# Patient Record
Sex: Male | Born: 1965 | Race: White | Hispanic: No | Marital: Single | Smoking: Never smoker
Health system: Southern US, Community
[De-identification: ages and names within clinical notes are randomized; demographics above are authoritative.]

## PROBLEM LIST (undated history)

## (undated) DIAGNOSIS — E785 Hyperlipidemia, unspecified: Secondary | ICD-10-CM

## (undated) DIAGNOSIS — M109 Gout, unspecified: Secondary | ICD-10-CM

## (undated) DIAGNOSIS — E079 Disorder of thyroid, unspecified: Secondary | ICD-10-CM

## (undated) DIAGNOSIS — I10 Essential (primary) hypertension: Secondary | ICD-10-CM

## (undated) DIAGNOSIS — K219 Gastro-esophageal reflux disease without esophagitis: Secondary | ICD-10-CM

## (undated) HISTORY — DX: Gout, unspecified: M10.9

## (undated) HISTORY — DX: Gastro-esophageal reflux disease without esophagitis: K21.9

---

## 2000-04-26 ENCOUNTER — Ambulatory Visit (HOSPITAL_COMMUNITY): Admission: RE | Admit: 2000-04-26 | Discharge: 2000-04-26 | Payer: Self-pay

## 2004-09-29 ENCOUNTER — Ambulatory Visit: Payer: Self-pay | Admitting: Family Medicine

## 2004-09-30 ENCOUNTER — Ambulatory Visit: Payer: Self-pay | Admitting: Family Medicine

## 2004-10-04 ENCOUNTER — Ambulatory Visit: Payer: Self-pay | Admitting: Family Medicine

## 2005-08-25 ENCOUNTER — Ambulatory Visit: Payer: Self-pay | Admitting: Family Medicine

## 2005-08-30 ENCOUNTER — Ambulatory Visit: Payer: Self-pay | Admitting: Family Medicine

## 2006-09-12 ENCOUNTER — Ambulatory Visit: Payer: Self-pay | Admitting: Family Medicine

## 2007-01-19 ENCOUNTER — Ambulatory Visit: Payer: Self-pay | Admitting: Family Medicine

## 2007-02-13 ENCOUNTER — Ambulatory Visit: Payer: Self-pay | Admitting: Family Medicine

## 2007-09-12 ENCOUNTER — Ambulatory Visit (HOSPITAL_BASED_OUTPATIENT_CLINIC_OR_DEPARTMENT_OTHER): Admission: RE | Admit: 2007-09-12 | Discharge: 2007-09-12 | Payer: Self-pay | Admitting: Family Medicine

## 2007-09-15 ENCOUNTER — Ambulatory Visit: Payer: Self-pay | Admitting: Internal Medicine

## 2007-11-13 ENCOUNTER — Ambulatory Visit (HOSPITAL_BASED_OUTPATIENT_CLINIC_OR_DEPARTMENT_OTHER): Admission: RE | Admit: 2007-11-13 | Discharge: 2007-11-13 | Payer: Self-pay | Admitting: Family Medicine

## 2007-11-17 ENCOUNTER — Ambulatory Visit: Payer: Self-pay | Admitting: Internal Medicine

## 2011-03-29 NOTE — Procedures (Signed)
NAME:  Hayden Hayes, Hayden Hayes NO.:  [id number]   MEDICAL RECORD NO.:  [id number]          PATIENT TYPE:  OUT   LOCATION:  SLEEP CENTER                 FACILITY:  [facility]   PHYSICIAN:  Clinton D. Maple Hudson, MD, FCCP, FACPDATE OF BIRTH:  1965-01-27   DATE OF STUDY:  11/13/2007                            NOCTURNAL POLYSOMNOGRAM   REFERRING PHYSICIAN:  Delaney Meigs, M.D.   REFERRING PHYSICIAN:  Delaney Meigs, M.D.   INDICATIONS FOR STUDY:  Hypersomnia with sleep apnea.   EPWORTH SLEEPINESS SCORE:  9/24.  BMI 51.  Weight 270 pounds.  Height 61  inches.  Neck 18 inches.   MEDICATIONS:  Charted and reviewed.   A diagnostic study on September 12, 2007, recorded a baseline AHI of 8.4  per hour.  CPAP titration is now requested.   SLEEP ARCHITECTURE:  Total sleep time is 397 minutes with sleep  efficiency 88.4%.  Stage 1 was 6.8%, stage 2 82%, stage 3 was absent.  REM 11.2% of total sleep time.  Sleep latency 9 minutes.  REM latency  150 minutes.  Awake after sleep onset 41 minutes.  Arousal index 11.6.  Aspirin, Lipitor, enalapril and Seroquel were taken at bedtime.   RESPIRATORY DATA:  CPAP titration protocol.  CPAP was titrated to 12  CWP, AHI 0 per hour.  A large Quattro full face mask was used with  heated humidifier.   OXYGEN DATA:  Snoring was prevented by CPAP and oxygen saturation held  at 95.4% on room air.   CARDIAC DATA:  Normal sinus rhythm.   MOVEMENT-PARASOMNIA:  Periodic limb movement syndrome with a total of 99  events averaging 15 per hour of sleep.   IMPRESSIONS-RECOMMENDATIONS:  1. Successful CPAP titration to 12 CWP, AHI 0 per hour.  A [place]      Quattro full face mask size large was used with heated humidifier.  2. Baseline diagnostic NPSG on September 12, 2007, had recorded an AHI      of 8.4 per hour.  3. Periodic limb movement with arousal 15 per hour.  This is often      associated with the disturbance in CPAP      titration.  If similar  pattern contributes to sleep disturbance at      home then specific therapy such as Requip or Mirapex might be      considered if clinically appropriate.      Clinton D. Maple Hudson, MD, [facility], FACP  Diplomate, Biomedical engineer of Sleep Medicine  Electronically Signed     CDY/MEDQ  D:  11/17/2007 11:02:34  T:  11/17/2007 11:44:28  Job:  [id number]

## 2011-03-29 NOTE — Procedures (Signed)
NAME:  Hayden Hayes, Hayden Hayes NO.:  [id number]   MEDICAL RECORD NO.:  [id number]          PATIENT TYPE:  OUT   LOCATION:  SLEEP CENTER                 FACILITY:  [facility]   PHYSICIAN:  Clinton D. Maple Hudson, MD, FCCP, FACPDATE OF BIRTH:  03/03/65   DATE OF STUDY:  09/12/2007                            NOCTURNAL POLYSOMNOGRAM   REFERRING PHYSICIAN:  Delaney Meigs, M.D.   INDICATION FOR STUDY:  Hypersomnia with sleep apnea.   EPWORTH SLEEPINESS SCORE:  5/24, BMI 53.7, weight 275 pounds, height 60  inches, neck 16 inches.   MEDICATIONS:  Home medications:  Lipitor, enalapril, Seroquel.   Diagnostic NPSG requested.   SLEEP ARCHITECTURE:  Total sleep time 387 minutes with sleep efficiency  94%.  Stage I was 9%, stage II 80%, stage III 9%, REM 9% of total sleep  time.  Sleep latency 11 minutes, REM latency 145 minutes, awake after  sleep onset 13 minutes, arousal index 14.  No bedtime medication taken.   RESPIRATORY DATA:  Apnea-hypopnea index (AHI) 8.4 obstructive events per  hour, indicating mild obstructive sleep apnea/hypopnea syndrome.  This  included 6 obstructive apneas, 1 mixed apnea and 47 hypopneas.  Events  were positional, more common while supine (AHI 11).  REM AHI 24.   OXYGEN DATA:  Very loud snoring and oral venting with oxygen  desaturation to a nadir of 75%.  Mean oxygen saturation through the  study was 94% on room air.   CARDIAC DATA:  Normal sinus rhythm.   MOVEMENT-PARASOMNIA:  Frequent limb jerks were noted with only  occasional effect on sleep.   IMPRESSIONS-RECOMMENDATIONS:  1. Mild obstructive sleep apnea/hypopnea syndrome (AHI 8.4 per hour)      with positional events more common while supine.  Very loud snoring      with oxygen desaturation to a nadir of 75 percent.  2. Scores in this range may be addressed with continuous positive      airway pressure therapy if the patient is      symptomatic.  Consider return for continuous positive  airway      pressure titration or evaluate for alternative therapies including      weight loss, as appropriate.      Clinton D. Maple Hudson, MD, [facility], FACP  Diplomate, Biomedical engineer of Sleep Medicine  Electronically Signed     CDY/MEDQ  D:  09/15/2007 18:07:44  T:  09/16/2007 23:33:55  Job:  [id number]

## 2014-04-30 DIAGNOSIS — I10 Essential (primary) hypertension: Secondary | ICD-10-CM | POA: Insufficient documentation

## 2014-04-30 DIAGNOSIS — M109 Gout, unspecified: Secondary | ICD-10-CM | POA: Insufficient documentation

## 2014-04-30 DIAGNOSIS — E785 Hyperlipidemia, unspecified: Secondary | ICD-10-CM | POA: Insufficient documentation

## 2018-08-29 DIAGNOSIS — E039 Hypothyroidism, unspecified: Secondary | ICD-10-CM | POA: Insufficient documentation

## 2019-07-11 ENCOUNTER — Other Ambulatory Visit: Payer: Self-pay

## 2019-07-11 ENCOUNTER — Encounter: Payer: Self-pay | Admitting: Family

## 2019-07-11 ENCOUNTER — Encounter (INDEPENDENT_AMBULATORY_CARE_PROVIDER_SITE_OTHER): Payer: Self-pay

## 2019-07-11 ENCOUNTER — Ambulatory Visit: Payer: Medicaid Other | Admitting: Family

## 2019-07-11 VITALS — BP 124/80 | HR 78 | Temp 98.4°F | Ht 61.0 in | Wt 396.0 lb

## 2019-07-11 DIAGNOSIS — I10 Essential (primary) hypertension: Secondary | ICD-10-CM

## 2019-07-11 DIAGNOSIS — D1779 Benign lipomatous neoplasm of other sites: Secondary | ICD-10-CM

## 2019-07-11 DIAGNOSIS — E039 Hypothyroidism, unspecified: Secondary | ICD-10-CM | POA: Diagnosis not present

## 2019-07-11 DIAGNOSIS — F411 Generalized anxiety disorder: Secondary | ICD-10-CM | POA: Insufficient documentation

## 2019-07-11 DIAGNOSIS — R231 Pallor: Secondary | ICD-10-CM

## 2019-07-11 DIAGNOSIS — E785 Hyperlipidemia, unspecified: Secondary | ICD-10-CM

## 2019-07-11 DIAGNOSIS — F321 Major depressive disorder, single episode, moderate: Secondary | ICD-10-CM | POA: Insufficient documentation

## 2019-07-11 DIAGNOSIS — G4733 Obstructive sleep apnea (adult) (pediatric): Secondary | ICD-10-CM

## 2019-07-11 DIAGNOSIS — Z9989 Dependence on other enabling machines and devices: Secondary | ICD-10-CM

## 2019-07-11 DIAGNOSIS — Z8249 Family history of ischemic heart disease and other diseases of the circulatory system: Secondary | ICD-10-CM

## 2019-07-11 NOTE — Progress Notes (Signed)
Subjective:    Patient ID: Hayden Hayes, male    DOB: 1966/05/25, 53 y.o.   MRN: [id number]  Chief Complaint  Patient presents with  . New Patient (Initial Visit)   PT presents to the office today to establish care after his provider retired.  Hyperlipidemia This is a chronic problem. The current episode started more than 1 year ago. The problem is uncontrolled. Recent lipid tests were reviewed and are high. Exacerbating diseases include obesity. Associated symptoms include shortness of breath. Current antihyperlipidemic treatment includes statins. The current treatment provides mild improvement of lipids. Risk factors for coronary artery disease include dyslipidemia, male sex, hypertension, obesity, family history and a sedentary lifestyle.  Hypertension This is a chronic problem. The current episode started more than 1 year ago. The problem has been resolved since onset. The problem is controlled. Associated symptoms include malaise/fatigue, peripheral edema and shortness of breath. Risk factors for coronary artery disease include dyslipidemia, obesity, male gender and sedentary lifestyle. The current treatment provides moderate improvement. Identifiable causes of hypertension include a thyroid problem.  Thyroid Problem Presents for follow-up visit. Symptoms include depressed mood and fatigue. Patient reports no constipation. The symptoms have been stable. His past medical history is significant for hyperlipidemia.  OSA PT uses CPAP every night. Stable.     Review of Systems  Constitutional: Positive for fatigue and malaise/fatigue.  Respiratory: Positive for shortness of breath.   Gastrointestinal: Negative for constipation.  All other systems reviewed and are negative.   Family History  Problem Relation Age of Onset  . Heart disease Mother   . Epilepsy Mother   . Heart disease Father        Heart attacks and stents in neck   Social History   Socioeconomic History  .  Marital status: Single    Spouse name: Not on file  . Number of children: Not on file  . Years of education: Not on file  . Highest education level: Not on file  Occupational History  . Occupation: Disabled  Social Needs  . Financial resource strain: Not on file  . Food insecurity    Worry: Not on file    Inability: Not on file  . Transportation needs    Medical: Not on file    Non-medical: Not on file  Tobacco Use  . Smoking status: Never Smoker  . Smokeless tobacco: Never Used  Substance and Sexual Activity  . Alcohol use: Never    Frequency: Never  . Drug use: Never  . Sexual activity: Not on file  Lifestyle  . Physical activity    Days per week: Not on file    Minutes per session: Not on file  . Stress: Not on file  Relationships  . Social Herbalist on phone: Not on file    Gets together: Not on file    Attends religious service: Not on file    Active member of club or organization: Not on file    Attends meetings of clubs or organizations: Not on file    Relationship status: Not on file  . Intimate partner violence    Fear of current or ex partner: Not on file    Emotionally abused: Not on file    Physically abused: Not on file    Forced sexual activity: Not on file  Other Topics Concern  . Not on file  Social History Narrative  . Not on file       Objective:  Physical Exam Vitals signs reviewed.  Constitutional:      General: He is not in acute distress.    Appearance: He is well-developed. He is obese.  HENT:     Head: Normocephalic.     Right Ear: Tympanic membrane normal.     Left Ear: Tympanic membrane normal.  Eyes:     General:        Right eye: No discharge.        Left eye: No discharge.     Pupils: Pupils are equal, round, and reactive to light.  Neck:     Musculoskeletal: Normal range of motion and neck supple.     Thyroid: No thyromegaly.  Cardiovascular:     Rate and Rhythm: Normal rate and regular rhythm.     Heart  sounds: Normal heart sounds. No murmur.  Pulmonary:     Effort: Pulmonary effort is normal. No respiratory distress.     Breath sounds: Normal breath sounds. No wheezing.  Abdominal:     General: Bowel sounds are normal. There is no distension.     Palpations: Abdomen is soft.     Tenderness: There is no abdominal tenderness.  Musculoskeletal: Normal range of motion.        General: No tenderness.  Skin:    General: Skin is warm and dry.     Coloration: Skin is pale.     Findings: No erythema or rash.     Comments: Large fatty area hanging from right   Neurological:     Mental Status: He is alert and oriented to person, place, and time.     Cranial Nerves: No cranial nerve deficit.     Deep Tendon Reflexes: Reflexes are normal and symmetric.  Psychiatric:        Behavior: Behavior normal.        Thought Content: Thought content normal.        Judgment: Judgment normal.     BP 124/80   Pulse 78   Temp 98.4 F (36.9 C) (Oral)   Ht '5\' 1"'  (1.549 m)   Wt (!) 396 lb (179.6 kg)   BMI 74.82 kg/m      Assessment & Plan:  Nayquan Evinger comes in today with chief complaint of New Patient (Initial Visit)   Diagnosis and orders addressed:  1. Essential hypertension with goal blood pressure less than 130/80 - CMP14+EGFR - Ambulatory referral to Cardiology  2. Hypothyroidism, unspecified type - CMP14+EGFR - TSH  3. Morbid obesity due to excess calories (HCC) - CMP14+EGFR  4. Hyperlipidemia, unspecified hyperlipidemia type - CMP14+EGFR - Lipid panel - Ambulatory referral to Cardiology  5. GAD (generalized anxiety disorder) - CMP14+EGFR  6. Depression, major, single episode, moderate (HCC) - CMP14+EGFR  7. OSA on CPAP - CMP14+EGFR - Ambulatory referral to Cardiology  8. Pale - CMP14+EGFR - Anemia Profile B  9. Family history of cardiovascular disease - CMP14+EGFR - Ambulatory referral to Cardiology  10. Lipoma of other specified sites Will do [place] and if any  type of tumor will send to General Surgery. If not, will do referral to Bariatric surgery  - [place] MiscellaneoUS Localization; Future   Labs pending Health Maintenance reviewed Diet and exercise encouraged  Follow up plan: 1 month   Evelina Dun, FNP

## 2019-07-11 NOTE — Patient Instructions (Signed)

## 2019-07-12 ENCOUNTER — Other Ambulatory Visit: Payer: Self-pay | Admitting: Family

## 2019-07-12 DIAGNOSIS — R1901 Right upper quadrant abdominal swelling, mass and lump: Secondary | ICD-10-CM

## 2019-07-13 LAB — ANEMIA PROFILE B
Basophils Absolute: 0 10*3/uL (ref 0.0–0.2)
Basos: 1 %
EOS (ABSOLUTE): 0.2 10*3/uL (ref 0.0–0.4)
Eos: 3 %
Ferritin: 183 ng/mL (ref 30–400)
Folate: 12.7 ng/mL (ref >3.0)
Hematocrit: 39.9 % (ref 37.5–51.0)
Hemoglobin: 13.1 g/dL (ref 13.0–17.7)
Immature Grans (Abs): 0 10*3/uL (ref 0.0–0.1)
Immature Granulocytes: 0 %
Iron Saturation: 22 % (ref 15–55)
Iron: 70 ug/dL (ref 38–169)
Lymphocytes Absolute: 1.5 10*3/uL (ref 0.7–3.1)
Lymphs: 30 %
MCH: 30.6 pg (ref 26.6–33.0)
MCHC: 32.8 g/dL (ref 31.5–35.7)
MCV: 93 fL (ref 79–97)
Monocytes Absolute: 0.6 10*3/uL (ref 0.1–0.9)
Monocytes: 11 %
Neutrophils Absolute: 2.8 10*3/uL (ref 1.4–7.0)
Neutrophils: 55 %
Platelets: 254 10*3/uL (ref 150–450)
RBC: 4.28 x10E6/uL (ref 4.14–5.80)
RDW: 14.6 % (ref 11.6–15.4)
Retic Ct Pct: 2.1 % (ref 0.6–2.6)
Total Iron Binding Capacity: 317 ug/dL (ref 250–450)
UIBC: 247 ug/dL (ref 111–343)
Vitamin B-12: 327 pg/mL (ref 232–1245)
WBC: 5.2 10*3/uL (ref 3.4–10.8)

## 2019-07-13 LAB — CMP14+EGFR
ALT: 17 IU/L (ref 0–44)
AST: 19 IU/L (ref 0–40)
Albumin/Globulin Ratio: 1.6 (ref 1.2–2.2)
Albumin: 4.6 g/dL (ref 3.8–4.9)
Alkaline Phosphatase: 64 IU/L (ref 39–117)
BUN/Creatinine Ratio: 19 (ref 9–20)
BUN: 15 mg/dL (ref 6–24)
Bilirubin Total: 0.5 mg/dL (ref 0.0–1.2)
CO2: 21 mmol/L (ref 20–29)
Calcium: 9.3 mg/dL (ref 8.7–10.2)
Chloride: 105 mmol/L (ref 96–106)
Creatinine, Ser: 0.79 mg/dL (ref 0.76–1.27)
GFR calc Af Amer: 118 mL/min/{1.73_m2} (ref >59)
GFR calc non Af Amer: 102 mL/min/{1.73_m2} (ref >59)
Globulin, Total: 2.9 g/dL (ref 1.5–4.5)
Glucose: 97 mg/dL (ref 65–99)
Potassium: 4.1 mmol/L (ref 3.5–5.2)
Sodium: 142 mmol/L (ref 134–144)
Total Protein: 7.5 g/dL (ref 6.0–8.5)

## 2019-07-13 LAB — LIPID PANEL
Chol/HDL Ratio: 4.1 ratio (ref 0.0–5.0)
Cholesterol, Total: 175 mg/dL (ref 100–199)
HDL: 43 mg/dL (ref >39)
LDL Calculated: 98 mg/dL (ref 0–99)
Triglycerides: 168 mg/dL — ABNORMAL HIGH (ref 0–149)
VLDL Cholesterol Cal: 34 mg/dL (ref 5–40)

## 2019-07-13 LAB — TSH: TSH: 2.74 u[IU]/mL (ref 0.450–4.500)

## 2019-07-18 ENCOUNTER — Other Ambulatory Visit: Payer: Self-pay | Admitting: Family

## 2019-07-19 ENCOUNTER — Ambulatory Visit (HOSPITAL_COMMUNITY): Payer: Medicaid Other | Attending: Family

## 2019-08-14 ENCOUNTER — Ambulatory Visit: Payer: Medicaid Other | Admitting: Cardiology

## 2019-08-28 ENCOUNTER — Ambulatory Visit: Payer: Medicaid Other | Admitting: Cardiology

## 2019-09-12 ENCOUNTER — Encounter: Payer: Self-pay | Admitting: Surgery

## 2019-09-12 ENCOUNTER — Inpatient Hospital Stay (HOSPITAL_COMMUNITY)
Admission: EM | Admit: 2019-09-12 | Discharge: 2019-09-15 | DRG: 603 | Disposition: A | Discharge status: Home / Self Care | Payer: Medicaid Other | Attending: Internal Medicine | Admitting: Internal Medicine

## 2019-09-12 ENCOUNTER — Other Ambulatory Visit: Payer: Self-pay

## 2019-09-12 ENCOUNTER — Encounter (HOSPITAL_COMMUNITY): Payer: Self-pay | Admitting: Emergency Medicine

## 2019-09-12 DIAGNOSIS — L03115 Cellulitis of right lower limb: Secondary | ICD-10-CM | POA: Diagnosis present

## 2019-09-12 DIAGNOSIS — F411 Generalized anxiety disorder: Secondary | ICD-10-CM | POA: Diagnosis present

## 2019-09-12 DIAGNOSIS — Z8249 Family history of ischemic heart disease and other diseases of the circulatory system: Secondary | ICD-10-CM | POA: Diagnosis not present

## 2019-09-12 DIAGNOSIS — Z79890 Hormone replacement therapy (postmenopausal): Secondary | ICD-10-CM | POA: Diagnosis not present

## 2019-09-12 DIAGNOSIS — L03116 Cellulitis of left lower limb: Secondary | ICD-10-CM | POA: Diagnosis present

## 2019-09-12 DIAGNOSIS — C801 Malignant (primary) neoplasm, unspecified: Secondary | ICD-10-CM

## 2019-09-12 DIAGNOSIS — L03311 Cellulitis of abdominal wall: Principal | ICD-10-CM | POA: Diagnosis present

## 2019-09-12 DIAGNOSIS — E039 Hypothyroidism, unspecified: Secondary | ICD-10-CM | POA: Diagnosis present

## 2019-09-12 DIAGNOSIS — K6289 Other specified diseases of anus and rectum: Secondary | ICD-10-CM | POA: Diagnosis present

## 2019-09-12 DIAGNOSIS — G4733 Obstructive sleep apnea (adult) (pediatric): Secondary | ICD-10-CM | POA: Diagnosis present

## 2019-09-12 DIAGNOSIS — E785 Hyperlipidemia, unspecified: Secondary | ICD-10-CM | POA: Diagnosis present

## 2019-09-12 DIAGNOSIS — Z7401 Bed confinement status: Secondary | ICD-10-CM | POA: Diagnosis not present

## 2019-09-12 DIAGNOSIS — K219 Gastro-esophageal reflux disease without esophagitis: Secondary | ICD-10-CM | POA: Diagnosis present

## 2019-09-12 DIAGNOSIS — M793 Panniculitis, unspecified: Secondary | ICD-10-CM | POA: Diagnosis present

## 2019-09-12 DIAGNOSIS — Z20828 Contact with and (suspected) exposure to other viral communicable diseases: Secondary | ICD-10-CM | POA: Diagnosis present

## 2019-09-12 DIAGNOSIS — I10 Essential (primary) hypertension: Secondary | ICD-10-CM | POA: Diagnosis present

## 2019-09-12 DIAGNOSIS — F329 Major depressive disorder, single episode, unspecified: Secondary | ICD-10-CM | POA: Diagnosis present

## 2019-09-12 DIAGNOSIS — Z820 Family history of epilepsy and other diseases of the nervous system: Secondary | ICD-10-CM | POA: Diagnosis not present

## 2019-09-12 DIAGNOSIS — N493 Fournier gangrene: Secondary | ICD-10-CM

## 2019-09-12 DIAGNOSIS — M109 Gout, unspecified: Secondary | ICD-10-CM | POA: Diagnosis present

## 2019-09-12 DIAGNOSIS — E876 Hypokalemia: Secondary | ICD-10-CM | POA: Diagnosis present

## 2019-09-12 DIAGNOSIS — E65 Localized adiposity: Secondary | ICD-10-CM

## 2019-09-12 DIAGNOSIS — Z6841 Body Mass Index (BMI) 40.0 and over, adult: Secondary | ICD-10-CM

## 2019-09-12 HISTORY — DX: Essential (primary) hypertension: I10

## 2019-09-12 HISTORY — DX: Disorder of thyroid, unspecified: E07.9

## 2019-09-12 HISTORY — DX: Hyperlipidemia, unspecified: E78.5

## 2019-09-12 LAB — CBC WITH DIFFERENTIAL/PLATELET
Abs Immature Granulocytes: 0.02 10*3/uL (ref 0.00–0.07)
Basophils Absolute: 0 10*3/uL (ref 0.0–0.1)
Basophils Relative: 1 %
Eosinophils Absolute: 0.1 10*3/uL (ref 0.0–0.5)
Eosinophils Relative: 2 %
HCT: 37.8 % — ABNORMAL LOW (ref 39.0–52.0)
Hemoglobin: 12.5 g/dL — ABNORMAL LOW (ref 13.0–17.0)
Immature Granulocytes: 0 %
Lymphocytes Relative: 23 %
Lymphs Abs: 1.2 10*3/uL (ref 0.7–4.0)
MCH: 30.6 pg (ref 26.0–34.0)
MCHC: 33.1 g/dL (ref 30.0–36.0)
MCV: 92.6 fL (ref 80.0–100.0)
Monocytes Absolute: 0.5 10*3/uL (ref 0.1–1.0)
Monocytes Relative: 9 %
Neutro Abs: 3.5 10*3/uL (ref 1.7–7.7)
Neutrophils Relative %: 65 %
Platelets: 211 10*3/uL (ref 150–400)
RBC: 4.08 MIL/uL — ABNORMAL LOW (ref 4.22–5.81)
RDW: 14.4 % (ref 11.5–15.5)
WBC: 5.4 10*3/uL (ref 4.0–10.5)
nRBC: 0 % (ref 0.0–0.2)

## 2019-09-12 LAB — CBC
HCT: 36.3 % — ABNORMAL LOW (ref 39.0–52.0)
Hemoglobin: 12.1 g/dL — ABNORMAL LOW (ref 13.0–17.0)
MCH: 30.9 pg (ref 26.0–34.0)
MCHC: 33.3 g/dL (ref 30.0–36.0)
MCV: 92.6 fL (ref 80.0–100.0)
Platelets: 213 10*3/uL (ref 150–400)
RBC: 3.92 MIL/uL — ABNORMAL LOW (ref 4.22–5.81)
RDW: 14.5 % (ref 11.5–15.5)
WBC: 5.1 10*3/uL (ref 4.0–10.5)
nRBC: 0 % (ref 0.0–0.2)

## 2019-09-12 LAB — BASIC METABOLIC PANEL
Anion gap: 7 (ref 5–15)
BUN: 11 mg/dL (ref 6–20)
CO2: 24 mmol/L (ref 22–32)
Calcium: 8.9 mg/dL (ref 8.9–10.3)
Chloride: 106 mmol/L (ref 98–111)
Creatinine, Ser: 0.81 mg/dL (ref 0.61–1.24)
GFR calc Af Amer: >60 mL/min (ref >60)
GFR calc non Af Amer: >60 mL/min (ref >60)
Glucose, Bld: 106 mg/dL — ABNORMAL HIGH (ref 70–99)
Potassium: 3.3 mmol/L — ABNORMAL LOW (ref 3.5–5.1)
Sodium: 137 mmol/L (ref 135–145)

## 2019-09-12 LAB — CREATININE, SERUM
Creatinine, Ser: 0.8 mg/dL (ref 0.61–1.24)
GFR calc Af Amer: >60 mL/min (ref >60)
GFR calc non Af Amer: >60 mL/min (ref >60)

## 2019-09-12 LAB — HIV ANTIBODY (ROUTINE TESTING W REFLEX): HIV Screen 4th Generation wRfx: NONREACTIVE

## 2019-09-12 LAB — SARS CORONAVIRUS 2 BY RT PCR (HOSPITAL ORDER, PERFORMED IN ~~LOC~~ HOSPITAL LAB): SARS Coronavirus 2: NEGATIVE

## 2019-09-12 MED ORDER — VANCOMYCIN HCL 10 G IV SOLR
2500.0000 mg | Freq: Once | INTRAVENOUS | Status: AC
  Administered 2019-09-12: 2500 mg via INTRAVENOUS
  Filled 2019-09-12: qty 2500

## 2019-09-12 MED ORDER — PANTOPRAZOLE SODIUM 40 MG PO TBEC
40.0000 mg | DELAYED_RELEASE_TABLET | Freq: Every day | ORAL | Status: DC
  Administered 2019-09-13 – 2019-09-15 (×3): 40 mg via ORAL
  Filled 2019-09-12 (×3): qty 1

## 2019-09-12 MED ORDER — ENOXAPARIN SODIUM 100 MG/ML ~~LOC~~ SOLN
90.0000 mg | SUBCUTANEOUS | Status: DC
  Administered 2019-09-12 – 2019-09-14 (×3): 90 mg via SUBCUTANEOUS
  Filled 2019-09-12 (×3): qty 1

## 2019-09-12 MED ORDER — ACETAMINOPHEN 325 MG PO TABS: 650.0000 mg | ORAL_TABLET | Freq: Four times a day (QID) | ORAL | Status: DC | PRN

## 2019-09-12 MED ORDER — COLCHICINE 0.6 MG PO TABS
0.6000 mg | ORAL_TABLET | Freq: Two times a day (BID) | ORAL | Status: DC
  Administered 2019-09-12 – 2019-09-15 (×6): 0.6 mg via ORAL
  Filled 2019-09-12 (×6): qty 1

## 2019-09-12 MED ORDER — LEVOTHYROXINE SODIUM 75 MCG PO TABS
175.0000 ug | ORAL_TABLET | Freq: Every day | ORAL | Status: DC
  Administered 2019-09-13 – 2019-09-15 (×3): 175 ug via ORAL
  Filled 2019-09-12 (×3): qty 1

## 2019-09-12 MED ORDER — VANCOMYCIN HCL 10 G IV SOLR
1500.0000 mg | Freq: Two times a day (BID) | INTRAVENOUS | Status: DC
  Administered 2019-09-12 – 2019-09-15 (×6): 1500 mg via INTRAVENOUS
  Filled 2019-09-12 (×8): qty 1500

## 2019-09-12 MED ORDER — SIMVASTATIN 40 MG PO TABS
40.0000 mg | ORAL_TABLET | Freq: Every day | ORAL | Status: DC
  Administered 2019-09-12 – 2019-09-15 (×4): 40 mg via ORAL
  Filled 2019-09-12 (×4): qty 1

## 2019-09-12 MED ORDER — PIPERACILLIN-TAZOBACTAM 3.375 G IVPB 30 MIN: 3.3750 g | Freq: Once | INTRAVENOUS | Status: DC

## 2019-09-12 MED ORDER — ACETAMINOPHEN 650 MG RE SUPP: 650.0000 mg | Freq: Four times a day (QID) | RECTAL | Status: DC | PRN

## 2019-09-12 MED ORDER — ENALAPRIL MALEATE 2.5 MG PO TABS
2.5000 mg | ORAL_TABLET | Freq: Every day | ORAL | Status: DC
  Administered 2019-09-12 – 2019-09-15 (×4): 2.5 mg via ORAL
  Filled 2019-09-12 (×4): qty 1

## 2019-09-12 MED ORDER — SODIUM CHLORIDE 0.9 % IV SOLN
INTRAVENOUS | Status: DC
  Administered 2019-09-12: 19:00:00 via INTRAVENOUS

## 2019-09-12 MED ORDER — QUETIAPINE FUMARATE 25 MG PO TABS
25.0000 mg | ORAL_TABLET | Freq: Every day | ORAL | Status: DC
  Administered 2019-09-12 – 2019-09-15 (×4): 25 mg via ORAL
  Filled 2019-09-12 (×4): qty 1

## 2019-09-12 MED ORDER — POTASSIUM CHLORIDE CRYS ER 20 MEQ PO TBCR
40.0000 meq | EXTENDED_RELEASE_TABLET | Freq: Once | ORAL | Status: AC
  Administered 2019-09-12: 40 meq via ORAL
  Filled 2019-09-12: qty 2

## 2019-09-12 MED ORDER — PIPERACILLIN-TAZOBACTAM 3.375 G IVPB 30 MIN
3.3750 g | Freq: Once | INTRAVENOUS | Status: AC
  Administered 2019-09-12: 05:00:00 3.375 g via INTRAVENOUS
  Filled 2019-09-12: qty 50

## 2019-09-12 MED ORDER — ONDANSETRON HCL 4 MG/2ML IJ SOLN: 4.0000 mg | Freq: Four times a day (QID) | INTRAMUSCULAR | Status: DC | PRN

## 2019-09-12 MED ORDER — PIPERACILLIN-TAZOBACTAM 3.375 G IVPB
3.3750 g | Freq: Three times a day (TID) | INTRAVENOUS | Status: DC
  Administered 2019-09-12 – 2019-09-15 (×9): 3.375 g via INTRAVENOUS
  Filled 2019-09-12 (×9): qty 50

## 2019-09-12 MED ORDER — VANCOMYCIN HCL IN DEXTROSE 1-5 GM/200ML-% IV SOLN: 1000.0000 mg | Freq: Once | INTRAVENOUS | Status: DC

## 2019-09-12 MED ORDER — ONDANSETRON HCL 4 MG PO TABS: 4.0000 mg | ORAL_TABLET | Freq: Four times a day (QID) | ORAL | Status: DC | PRN

## 2019-09-12 NOTE — ED Notes (Signed)
General Surgery PA at bedside 

## 2019-09-12 NOTE — Consult Note (Signed)
Urology Consult Note   Requesting Attending Physician:  Ward, Delice Bison, DO Service Providing Consult: Urology  Consulting Attending: Dr. Bjorn Loser   Reason for Consult:  Concern for scrotal infection  HPI: Hayden Hayes is seen in consultation for reasons noted above at the request of Ward, Delice Bison, DO for evaluation of possible Fournier's Gangrene.  This is a 53 y.o. male with history of morbid obesity, HTN, HLD, GAD/depression, OSA, GERD, Gout who presented to OSH with 3 days of right abdominal pain.   Was afebrile, hemodynamically stable, no leukocytosis, normal renal function; CT report with subcutaneous stranding and fat infiltration along the right lateral abdominal wall and soft tissue thickening with fluid along fascial planes; rectal wall thickening, mesenteric stranding, mild perinephric stranding; difficult exam.   He reports 3 days of right flank pain, "like I got punched in the stomach" pointing to his RUQ. No fevers, myalgias, nausea, emesis, constipation, flank pain, chest pain. Baseline SOB, no new respiratory symptoms. Voids 5-6x per day, nocturia x0-1. Unable to expose glans. Stands to urinate. Never seen a urologist prior. Denies dysuria. UA without concern for infection.   He confirms he has not been diagnosed with diabetes.   Lives in apartment alone. Sister assists with some activities.   Past Medical History: Morbid obesity, HTN, HLD, GAD/depression, OSA, GERD, Gout  Past Surgical History:  Denies  Medication: No current facility-administered medications for this encounter.    Current Outpatient Medications  Medication Sig Dispense Refill   enalapril (VASOTEC) 2.5 MG tablet TAKE 1 TABLET BY MOUTH EVERY DAY (Patient taking differently: Take 2.5 mg by mouth daily. ) 30 tablet 4   levothyroxine (SYNTHROID) 175 MCG tablet TAKE ONE TABLET (175 MCG DOSE) BY MOUTH DAILY. (Patient taking differently: Take 175 mcg by mouth daily. ) 30 tablet 3   MITIGARE  0.6 MG CAPS Take 0.6 mg by mouth 2 (two) times daily.     omeprazole (PRILOSEC) 20 MG capsule Take 20 mg by mouth daily.     QUEtiapine (SEROQUEL) 25 MG tablet Take 25 mg by mouth daily.     simvastatin (ZOCOR) 20 MG tablet TAKE 2 TABLETS BY MOUTH EVERY DAY (Patient taking differently: Take 40 mg by mouth daily. ) 60 tablet 4    Allergies: No Known Allergies  Social History: Social History   Tobacco Use   Smoking status: Never Smoker   Smokeless tobacco: Never Used  Substance Use Topics   Alcohol use: Never    Frequency: Never   Drug use: Never    Family History Family History  Problem Relation Age of Onset   Heart disease Mother    Epilepsy Mother    Heart disease Father        Heart attacks and stents in neck    Review of Systems 10 systems were reviewed and are negative except as noted specifically in the HPI.  Objective   Vital signs in last 24 hours: BP (!) 144/87    Pulse 100    Temp 98.1 F (36.7 C) (Oral)    Resp 15    SpO2 95%   Physical Exam General: NAD, A&O, resting, appropriate HEENT: [place]/AT, EOMI, MMM Pulmonary: Normal work of breathing Cardiovascular: HDS, adequate peripheral perfusion Abdomen: Morbid obese with large low hanging pannus. Soft, NTTP, nondistended, no reproducible right abdominal tenderness. GU: Required 2 assistants for exposure. Buried penis, excess scrotal skin, difficult exam, right descended testicle, left testicle somewhat higher in scrotum although limited by exam. No obvious cellulitis,  erythema. No crepitus. No fluctuant areas. Perineum without cellulitis, erythema or crepitus. Right inguinal crease with mild erythema/rash, not painful.  Extremities: warm and well perfused Neuro: Appropriate, no focal neurological deficits  Most Recent Labs: Lab Results  Component Value Date   WBC 5.2 07/11/2019   HGB 13.1 07/11/2019   HCT 39.9 07/11/2019   PLT 254 07/11/2019    Lab Results  Component Value Date   NA 142  07/11/2019   K 4.1 07/11/2019   CL 105 07/11/2019   CO2 21 07/11/2019   BUN 15 07/11/2019   CREATININE 0.79 07/11/2019   CALCIUM 9.3 07/11/2019    IMAGING: CT Ap/P from OSH 09/12/2019 Subcutaneous standing and fat infiltration along right lateral wall. Fluid tracking along fascial planes. Absence of soft tissue gas.  Rectal wall thickening and faint perirectal stranding Focal mild mesenteric hazy stranding.  Bilateral symmetric perinephric stranding.   ------  Assessment:  53 y.o. male with morbid obesity, HTN, HLD, GAD/depression, OSA, GERD, Gout who presented to OSH with 3 days of right abdominal pain. No clinic or objective GU exam concerns of infection. CT without gas in scrotum or perineum, no perineal/scrotal abscesses, no obvious cellulitis. Noted right extrarenal pelvis; normal renal function, no concern for infection on UA.    Recommendations: - No urologic intervention indicated at this time - Consult general surgery for right abdominal pain, CT scan findings of mesenteritis   Thank you for this consult. Please contact the urology consult pager with any further questions/concerns.

## 2019-09-12 NOTE — ED Triage Notes (Signed)
53 year old male brought in my [facility]. 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 Pt has 20 g left AC, pt has vancomycin hanging.   Vitals: 174/77 Hr 84 spo2 96 on 3 L of O2

## 2019-09-12 NOTE — ED Provider Notes (Signed)
Patient received at handoff from [facility] at 6:30am. See provider note for details.  Briefly:  Patient sent by EMS from [facility].  CT showed possible fournier's gangrene.  Urology and general surgery is assessing patient.  Likely medical admission for IV antibiotics.  Has received IV clindamycin and vancomycin and Zosyn.  8:05 AM patient evaluated by urology Dr. McDiarmid and gen surgery Dr. Johney Maine. Dr. Johney Maine assessed patient and reviewed CT images. Recommended medical admission for IV vancomycin and Zosyn for extensive cellulitis.  Dr. Darl Householder consulted hospitalists for admission. Dr. Darrick Meigs to admit.    Tedd Sias, [place] 09/12/19 [place], Delice Bison, DO 09/12/19 [id number]

## 2019-09-12 NOTE — ED Provider Notes (Signed)
  Physical Exam  BP (!) 191/97 Comment: Pt was re positioning in bed will re evaluate   Pulse 84 Comment: Pt was re positioning in bed will re evaluate   Temp 98.1 F (36.7 C) (Oral)   Resp 16   SpO2 97% Comment: Pt was re positioning in bed will re evaluate   Physical Exam  ED Course/Procedures     Procedures  MDM  Medical screening examination/treatment/procedure(s) were conducted as a shared visit with non-physician practitioner(s) and myself.  I personally evaluated the patient during the encounter.     Care assumed at 7 am. Patient here with possible Fournier's gangrene on CT ab/pel. Transferred from [place] to see urology. Dr. McDiarmid saw patient and recommend surgery consult. Dr. Johney Maine saw patient and reviewed images and recommend no surgical intervention, just IV abx. Urology and surgery to follow. Dr. Darrick Meigs to admit.   CRITICAL CARE Performed by: Wandra Arthurs   Total critical care time: 30 minutes  Critical care time was exclusive of separately billable procedures and treating other patients.  Critical care was necessary to treat or prevent imminent or life-threatening deterioration.  Critical care was time spent personally by me on the following activities: development of treatment plan with patient and/or surrogate as well as nursing, discussions with consultants, evaluation of patient's response to treatment, examination of patient, obtaining history from patient or surrogate, ordering and performing treatments and interventions, ordering and review of laboratory studies, ordering and review of radiographic studies, pulse oximetry and re-evaluation of patient's condition.     Drenda Freeze, MD 09/12/19 [phone]

## 2019-09-12 NOTE — H&P (Addendum)
TRH H&P    Patient Demographics:    Hayden Hayes, is a 53 y.o. male  MRN: [id number]  DOB - 04-09-1966  Admit Date - 09/12/2019  Referring MD/NP/PA: Dr. Darl Householder  Outpatient Primary MD for the patient is Sharion Balloon, FNP  Patient coming from: [facility]  Chief complaint-skin infection   HPI:    Hayden Hayes  is a 53 y.o. male, with history of morbid obesity, hypothyroidism, sleep apnea, GERD, hypertension, gout who was transferred from [facility] ER.  Patient initially presented with 3 days of abdominal pain.  CT scan showed subcutaneous stranding and fatty infiltration along the right lateral abdominal wall and soft tissue thickening of fluid along fascial planes, rectal wall thickening, mesenteric stranding, mild perinephric stranding.  Patient was transferred to [facility] long ED.  He was seen by general surgery and urology, Fournier's gangrene was ruled out.  No surgical intervention recommended.  IV antibiotic started. Patient denies nausea vomiting or diarrhea. Denies chest pain or shortness of breath Denies cough or fever.    Review of systems:    In addition to the HPI above,    All other systems reviewed and are negative.    Past History of the following :    Past Medical History:  Diagnosis Date  . GERD (gastroesophageal reflux disease)   . Gout   . Hyperlipidemia   . Hypertension   . Thyroid disease       No past surgical history on file.    Social History:      Social History   Tobacco Use  . Smoking status: Never Smoker  . Smokeless tobacco: Never Used  Substance Use Topics  . Alcohol use: Never    Frequency: Never       Family History :     Family History  Problem Relation Age of Onset  . Heart disease Mother   . Epilepsy Mother   . Heart disease Father        Heart attacks and stents in neck      Home Medications:   Prior to Admission medications   Medication  Sig Start Date End Date Taking? Authorizing Provider  enalapril (VASOTEC) 2.5 MG tablet TAKE 1 TABLET BY MOUTH EVERY DAY Patient taking differently: Take 2.5 mg by mouth daily.  07/19/19  Yes Hawks, Christy A, FNP  levothyroxine (SYNTHROID) 175 MCG tablet TAKE ONE TABLET (175 MCG DOSE) BY MOUTH DAILY. Patient taking differently: Take 175 mcg by mouth daily.  07/19/19  Yes Hawks, Christy A, FNP  MITIGARE 0.6 MG CAPS Take 0.6 mg by mouth 2 (two) times daily. 06/21/19  Yes [provider]  omeprazole (PRILOSEC) 20 MG capsule Take 20 mg by mouth daily. 06/21/19  Yes [provider]  QUEtiapine (SEROQUEL) 25 MG tablet Take 25 mg by mouth daily. 06/21/19  Yes [provider]  simvastatin (ZOCOR) 20 MG tablet TAKE 2 TABLETS BY MOUTH EVERY DAY Patient taking differently: Take 40 mg by mouth daily.  07/19/19  Yes Sharion Balloon, FNP  Allergies:    No Known Allergies   Physical Exam:   Vitals  Blood pressure (!) 166/89, pulse 88, temperature 98.1 F (36.7 C), temperature source Oral, resp. rate 18, SpO2 95 %.  1.  General: Appears in no acute distress  2. Psychiatric: Alert, oriented x3, intact insight and judgment  3. Neurologic: Cranial nerves II through XII grossly intact  4. HEENMT:  Atraumatic normocephalic, extraocular muscles are intact  5. Respiratory : Clear to auscultation bilaterally  6. Cardiovascular : S1-S2, regular, no murmur auscultated  7. Gastrointestinal:  Abdomen is soft, no organomegaly  8. Skin:  Large pannus noted, difficult exam mild erythema noted in the right groin               Data Review:    CBC Recent Labs  Lab 09/12/19 0849  WBC 5.4  HGB 12.5*  HCT 37.8*  PLT 211  MCV 92.6  MCH 30.6  MCHC 33.1  RDW 14.4  LYMPHSABS 1.2  MONOABS 0.5  EOSABS 0.1  BASOSABS 0.0   ------------------------------------------------------------------------------------------------------------------  Results for orders placed  or performed during the hospital encounter of 09/12/19 (from the past 48 hour(s))  SARS Coronavirus 2 by RT PCR (hospital order, performed in [facility] hospital lab) Nasopharyngeal Nasopharyngeal Swab     Status: None   Collection Time: 09/12/19  4:42 AM   Specimen: Nasopharyngeal Swab  Result Value Ref Range   SARS Coronavirus 2 NEGATIVE NEGATIVE    Comment: (NOTE) If result is NEGATIVE SARS-CoV-2 target nucleic acids are NOT DETECTED. The SARS-CoV-2 RNA is generally detectable in upper and lower  respiratory specimens during the acute phase of infection. The lowest  concentration of SARS-CoV-2 viral copies this assay can detect is 250  copies / mL. A negative result does not preclude SARS-CoV-2 infection  and should not be used as the sole basis for treatment or other  patient management decisions.  A negative result may occur with  improper specimen collection / handling, submission of specimen other  than nasopharyngeal swab, presence of viral mutation(s) within the  areas targeted by this assay, and inadequate number of viral copies  (<250 copies / mL). A negative result must be combined with clinical  observations, patient history, and epidemiological information. If result is POSITIVE SARS-CoV-2 target nucleic acids are DETECTED. The SARS-CoV-2 RNA is generally detectable in upper and lower  respiratory specimens dur ing the acute phase of infection.  Positive  results are indicative of active infection with SARS-CoV-2.  Clinical  correlation with patient history and other diagnostic information is  necessary to determine patient infection status.  Positive results do  not rule out bacterial infection or co-infection with other viruses. If result is PRESUMPTIVE POSTIVE SARS-CoV-2 nucleic acids MAY BE PRESENT.   A presumptive positive result was obtained on the submitted specimen  and confirmed on repeat testing.  While 2019 novel coronavirus  (SARS-CoV-2) nucleic acids may  be present in the submitted sample  additional confirmatory testing may be necessary for epidemiological  and / or clinical management purposes  to differentiate between  SARS-CoV-2 and other Sarbecovirus currently known to infect humans.  If clinically indicated additional testing with an alternate test  methodology [phone]) is advised. The SARS-CoV-2 RNA is generally  detectable in upper and lower respiratory sp ecimens during the acute  phase of infection. The expected result is Negative. Fact Sheet for Patients:  StrictlyIdeas.no Fact Sheet for Healthcare Providers: BankingDealers.co.za This test is not yet approved or cleared by  the [employer] and has been authorized for detection and/or diagnosis of SARS-CoV-2 by FDA under an Emergency Use Authorization (EUA).  This EUA will remain in effect (meaning this test can be used) for the duration of the COVID-19 declaration under Section 564(b)(1) of the Act, 21 U.S.C. section 360bbb-3(b)(1), unless the authorization is terminated or revoked sooner. Performed at [facility], [place] [address] [place], [place] [address]   CBC with Differential/Platelet     Status: Abnormal   Collection Time: 09/12/19  8:49 AM  Result Value Ref Range   WBC 5.4 4.0 - 10.5 K/uL   RBC 4.08 (L) 4.22 - 5.81 MIL/uL   Hemoglobin 12.5 (L) 13.0 - 17.0 g/dL   HCT 37.8 (L) 39.0 - 52.0 %   MCV 92.6 80.0 - 100.0 fL   MCH 30.6 26.0 - 34.0 pg   MCHC 33.1 30.0 - 36.0 g/dL   RDW 14.4 11.5 - 15.5 %   Platelets 211 150 - 400 K/uL   nRBC 0.0 0.0 - 0.2 %   Neutrophils Relative % 65 %   Neutro Abs 3.5 1.7 - 7.7 K/uL   Lymphocytes Relative 23 %   Lymphs Abs 1.2 0.7 - 4.0 K/uL   Monocytes Relative 9 %   Monocytes Absolute 0.5 0.1 - 1.0 K/uL   Eosinophils Relative 2 %   Eosinophils Absolute 0.1 0.0 - 0.5 K/uL   Basophils Relative 1 %   Basophils Absolute 0.0 0.0 - 0.1 K/uL   Immature  Granulocytes 0 %   Abs Immature Granulocytes 0.02 0.00 - 0.07 K/uL    Comment: Performed at [facility], [place] [address], [place], [place] [id number]  Basic metabolic panel     Status: Abnormal   Collection Time: 09/12/19  8:49 AM  Result Value Ref Range   Sodium 137 135 - 145 mmol/L   Potassium 3.3 (L) 3.5 - 5.1 mmol/L   Chloride 106 98 - 111 mmol/L   CO2 24 22 - 32 mmol/L   Glucose, Bld 106 (H) 70 - 99 mg/dL   BUN 11 6 - 20 mg/dL   Creatinine, Ser 0.81 0.61 - 1.24 mg/dL   Calcium 8.9 8.9 - 10.3 mg/dL   GFR calc non Af Amer >60 >60 mL/min   GFR calc Af Amer >60 >60 mL/min   Anion gap 7 5 - 15    Comment: Performed at [facility], [place] [address] [place], [place] [address]    Chemistries  Recent Labs  Lab 09/12/19 0849  NA 137  K 3.3*  CL 106  CO2 24  GLUCOSE 106*  BUN 11  CREATININE 0.81  CALCIUM 8.9    --------------------------------------------------------------------------------------------------------------- Urine analysis: No results found for: COLORURINE, APPEARANCEUR, LABSPEC, PHURINE, GLUCOSEU, HGBUR, BILIRUBINUR, KETONESUR, PROTEINUR, UROBILINOGEN, NITRITE, LEUKOCYTESUR    Imaging Results:       Assessment & Plan:    Active Problems:   Abdominal panniculus   Bedridden   Panniculitis   1. Abdominal panniculitis-patient was seen by general surgery, wound care for local wound care and IV antibiotics.  No evidence of Fournier's gangrene as per general surgery.  CT scan of the abdomen pelvis was reviewed by general surgery from OSH.  We will continue with vancomycin and Zosyn.  2. Hypokalemia-replace potassium, follow BMP in a.m.  3. Hypothyroidism-continue Synthroid.  4. Hyperlipidemia-continue Zocor  5. Gout-continue Mitigare.    DVT Prophylaxis-   Lovenox   AM Labs Ordered, also please review Full Orders  Family Communication: Admission, patients condition and plan of care including tests being ordered  have been discussed with the patient  who indicate understanding and agree with the plan and Code Status.  Code Status: Full code  Admission status: Inpatient: Based on patients clinical presentation and evaluation of above clinical data, I have made determination that patient meets Inpatient criteria at this time.  Time spent in minutes : 60 minutes   Oswald Hillock M.D on 09/12/2019 at 10:09 AM

## 2019-09-12 NOTE — Progress Notes (Signed)
Brief Pharmacy VTE prophylaxis note:  For BMI>30 and CrCl > 119mls/min Increase enoxaparin to 0.5mg /kg (90mg ) SQ q24h for VTE ppx  Dolly Rias RPh 09/12/2019, 4:42 PM

## 2019-09-12 NOTE — Consult Note (Signed)
[facility] Surgery Consult Note  Hayden Hayes [birth year]  [id number].    Requesting MD: Robbie Lis Chief Complaint: right sided thigh pain PCP:  Sharion Balloon, FNP  Reason for Consult: Cellulitis  HPI: Patient is a 53 year old obese male transferred from [facility] for possible Fournier's gangrene.  He presented to the ED down there with complaints of pain on the right thigh.   Pt says pain started about 2 days ago. CT scan showed asymmetric stranding and fat infiltration along the right lateral abdominal wall with additional soft tissue thickening and fluid tracking along the fascial planes most pronounced in the lower anterior pelvis.   There is mild rectal wall thickening and faint perirectal stranding suggestive of a proctitis, possibly reactive.   There is focal mid mesenteric hazy stranding with numerous reactive appearing clusters of mesenteric lymph nodes compatible with a mesenteric-itis.  Bilateral symmetric perinephric stranding.  He was afebrile and vital signs were stable.  CMP was essentially normal.  WBC 6.1 hemoglobin 13.4 hematocrit 40.3 platelets 273,000 urinalysis was unremarkable.  He was transferred to [facility] for further evaluation and treatment.  CT has been reviewed by Dr. Johney Maine; it is his opinion this is more consistent with a panniculitis, no deep gas, mesentery is under whelming.  He was also examined by DR. Fredderick Phenix, from urology and he saw no urologic issue.    ROS: Review of Systems  Constitutional: Negative.   HENT: Negative.   Eyes: Negative.   Respiratory: Negative.   Cardiovascular: Positive for leg swelling. Negative for chest pain, palpitations, orthopnea (Is on CPAP at home) and PND.  Gastrointestinal: Negative.   Genitourinary: Negative.   Musculoskeletal:       With his morbid obesity he is limited in his mobility.  He does some cooking.  His sister does her shopping.  He does live on his own.  Skin:       WE will have  pictures below but but what we are seeing is a cellulitis of the thighs and overlying panniculus right side more severe than the left.  He also has some generalized cellulitis of the panniculus distally.  Neurological: Negative.   Endo/Heme/Allergies: Negative.   Psychiatric/Behavioral: Negative.     Family History  Problem Relation Age of Onset  . Heart disease Mother   . Epilepsy Mother   . Heart disease Father        Heart attacks and stents in neck    Past Medical History:  Diagnosis Date  . GERD (gastroesophageal reflux disease)   . Gout   . Hyperlipidemia   . Hypertension   . Thyroid disease     No past surgical history on file.  Social History:  reports that he has never smoked. He has never used smokeless tobacco. He reports that he does not drink alcohol or use drugs.  Allergies: No Known Allergies  (Not in a hospital admission)   Blood pressure (!) 191/97, pulse 84, temperature 98.1 F (36.7 C), temperature source Oral, resp. rate 16, SpO2 97 %. Physical Exam: Physical Exam Constitutional:      General: He is not in acute distress.    Appearance: He is obese. He is not ill-appearing, toxic-appearing or diaphoretic.  HENT:     Head: Normocephalic and atraumatic.     Mouth/Throat:     Mouth: Mucous membranes are moist.  Eyes:     General: Scleral icterus present.     Conjunctiva/sclera: Conjunctivae normal.     Comments:  Pupils are equal  Neck:     Musculoskeletal: Normal range of motion and neck supple. No neck rigidity or muscular tenderness.     Vascular: No carotid bruit.  Cardiovascular:     Rate and Rhythm: Normal rate and regular rhythm.     Pulses: Normal pulses.  Pulmonary:     Effort: Pulmonary effort is normal.     Breath sounds: Normal breath sounds.  Abdominal:     Comments: Patient is morbidly obese with a huge panniculus.  He does report some pain with defecation and says the stool gets stuck.  He does not have issues with constipation.   He has been eating normally, normal bowel movements and has no abdominal pain.  Genitourinary:    Comments: The penis is retracted and you can see it.  Also difficult to palpate his testicles. Lymphadenopathy:     Cervical: No cervical adenopathy.  Skin:    General: Skin is warm.     Comments: As you can see from the pictures below.  He has a huge panniculus.  There is some generalized cellulitis of the panniculus.  He has significant erythema and tenderness of the groin and thighs under the panniculus on both sides.  The right side is more severe than the left.  He does have some skin breakdown on the left side, this is all under the panniculus and over his thigh.  We did not see any erythema around his penis.  The penis is completely retracted.  We did not feel any gas in the skin on palpation.  Neurological:     General: No focal deficit present.     Mental Status: He is alert and oriented to person, place, and time.     Cranial Nerves: No cranial nerve deficit.  Psychiatric:        Mood and Affect: Mood normal.        Behavior: Behavior normal.        Thought Content: Thought content normal.        Judgment: Judgment normal.       left     Right Results for orders placed or performed during the hospital encounter of 09/12/19 (from the past 48 hour(s))  SARS Coronavirus 2 by RT PCR (hospital order, performed in [facility] hospital lab) Nasopharyngeal Nasopharyngeal Swab     Status: None   Collection Time: 09/12/19  4:42 AM   Specimen: Nasopharyngeal Swab  Result Value Ref Range   SARS Coronavirus 2 NEGATIVE NEGATIVE    Comment: (NOTE) If result is NEGATIVE SARS-CoV-2 target nucleic acids are NOT DETECTED. The SARS-CoV-2 RNA is generally detectable in upper and lower  respiratory specimens during the acute phase of infection. The lowest  concentration of SARS-CoV-2 viral copies this assay can detect is 250  copies / mL. A negative result does not preclude SARS-CoV-2  infection  and should not be used as the sole basis for treatment or other  patient management decisions.  A negative result may occur with  improper specimen collection / handling, submission of specimen other  than nasopharyngeal swab, presence of viral mutation(s) within the  areas targeted by this assay, and inadequate number of viral copies  (<250 copies / mL). A negative result must be combined with clinical  observations, patient history, and epidemiological information. If result is POSITIVE SARS-CoV-2 target nucleic acids are DETECTED. The SARS-CoV-2 RNA is generally detectable in upper and lower  respiratory specimens dur ing the acute phase of infection.  Positive  results are indicative of active infection with SARS-CoV-2.  Clinical  correlation with patient history and other diagnostic information is  necessary to determine patient infection status.  Positive results do  not rule out bacterial infection or co-infection with other viruses. If result is PRESUMPTIVE POSTIVE SARS-CoV-2 nucleic acids MAY BE PRESENT.   A presumptive positive result was obtained on the submitted specimen  and confirmed on repeat testing.  While 2019 novel coronavirus  (SARS-CoV-2) nucleic acids may be present in the submitted sample  additional confirmatory testing may be necessary for epidemiological  and / or clinical management purposes  to differentiate between  SARS-CoV-2 and other Sarbecovirus currently known to infect humans.  If clinically indicated additional testing with an alternate test  methodology [phone]) is advised. The SARS-CoV-2 RNA is generally  detectable in upper and lower respiratory sp ecimens during the acute  phase of infection. The expected result is Negative. Fact Sheet for Patients:  StrictlyIdeas.no Fact Sheet for Healthcare Providers: BankingDealers.co.za This test is not yet approved or cleared by the [place]  FDA and has been authorized for detection and/or diagnosis of SARS-CoV-2 by FDA under an Emergency Use Authorization (EUA).  This EUA will remain in effect (meaning this test can be used) for the duration of the COVID-19 declaration under Section 564(b)(1) of the Act, 21 U.S.C. section 360bbb-3(b)(1), unless the authorization is terminated or revoked sooner. Performed at [facility], [place] [address], [place], [place] [address]    No results found.    Assessment/Plan Essential hypertension Hypothyroid Hyperlipidemia Gout Generalized anxiety disorder Obstructive sleep apnea on CPAP  Cellulitis of the panniculus, right and left thighs. Probable proctitis based on CT scan and history Morbid obesity  Plan: Medical admission, IV antibiotics; at this point I recommend vancomycin and Zosyn.  We will get wound care to see and help with moisture over each thigh that is under the pannus.  No surgical intervention recommended at this time.  We will follow with medicine.     Earnstine Regal [facility] Surgery 09/12/2019, 8:13 AM Please see Amion for pager number during day hours 7:00am-4:30pm

## 2019-09-12 NOTE — Consult Note (Signed)
HPI: Hayden Hayes is seen in consultation for reasons noted above at the request of Ward, Delice Bison, DO for evaluation of possible Fournier's Gangrene.  This is a 53 y.o. male with history of morbid obesity, HTN, HLD, GAD/depression, OSA, GERD, Gout who presented to OSH with 3 days of right abdominal pain.   Was afebrile, hemodynamically stable, no leukocytosis, normal renal function; CT report with subcutaneous stranding and fat infiltration along the right lateral abdominal wall and soft tissue thickening with fluid along fascial planes; rectal wall thickening, mesenteric stranding, mild perinephric stranding; difficult exam.   He reports 3 days of right flank pain, "like I got punched in the stomach" pointing to his RUQ. No fevers, myalgias, nausea, emesis, constipation, flank pain, chest pain. Baseline SOB, no new respiratory symptoms. Voids 5-6x per day, nocturia x0-1. Unable to expose glans. Stands to urinate. Never seen a urologist prior. Denies dysuria. UA without concern for infection.   I REPEATED HISTORY AND AGREE WITH FINDINGS  He confirms he has not been diagnosed with diabetes.   Lives in apartment alone. Sister assists with some activities.   Past Medical History: Morbid obesity, HTN, HLD, GAD/depression, OSA, GERD, Gout  Past Surgical History:  Denies  Medication: No current facility-administered medications for this encounter.          Current Outpatient Medications  Medication Sig Dispense Refill  . enalapril (VASOTEC) 2.5 MG tablet TAKE 1 TABLET BY MOUTH EVERY DAY (Patient taking differently: Take 2.5 mg by mouth daily. ) 30 tablet 4  . levothyroxine (SYNTHROID) 175 MCG tablet TAKE ONE TABLET (175 MCG DOSE) BY MOUTH DAILY. (Patient taking differently: Take 175 mcg by mouth daily. ) 30 tablet 3  . MITIGARE 0.6 MG CAPS Take 0.6 mg by mouth 2 (two) times daily.    Marland Kitchen omeprazole (PRILOSEC) 20 MG capsule Take 20 mg by mouth daily.    . QUEtiapine (SEROQUEL) 25  MG tablet Take 25 mg by mouth daily.    . simvastatin (ZOCOR) 20 MG tablet TAKE 2 TABLETS BY MOUTH EVERY DAY (Patient taking differently: Take 40 mg by mouth daily. ) 60 tablet 4    Allergies: No Known Allergies  Social History: Social History        Tobacco Use  . Smoking status: Never Smoker  . Smokeless tobacco: Never Used  Substance Use Topics  . Alcohol use: Never    Frequency: Never  . Drug use: Never    Family History      Family History  Problem Relation Age of Onset  . Heart disease Mother   . Epilepsy Mother   . Heart disease Father        Heart attacks and stents in neck    Review of Systems 10 systems were reviewed and are negative except as noted specifically in the HPI.  Objective   Vital signs in last 24 hours: BP (!) 144/87   Pulse 100   Temp 98.1 F (36.7 C) (Oral)   Resp 15   SpO2 95%   Physical Exam General: NAD, A&O, resting, appropriate HEENT: [place]/AT, EOMI, MMM Pulmonary: Normal work of breathing Cardiovascular: HDS, adequate peripheral perfusion Abdomen: Morbid obese with large low hanging pannus. Soft, NTTP, nondistended, no reproducible right abdominal tenderness. GU: Required 2 assistants for exposure. Buried penis, excess scrotal skin, difficult exam, right descended testicle, left testicle somewhat higher in scrotum although limited by exam. No obvious cellulitis, erythema. No crepitus. No fluctuant areas. Perineum without cellulitis, erythema or crepitus. Right inguinal  crease with mild erythema/rash, not painful.  I AGREE WITH FINDINGS AND AGREE WITH THEM- NO SCROTAL CELLULITIS FINDINS Extremities: warm and well perfused Neuro: Appropriate, no focal neurological deficits  Most Recent Labs: Recent Labs       Lab Results  Component Value Date   WBC 5.2 07/11/2019   HGB 13.1 07/11/2019   HCT 39.9 07/11/2019   PLT 254 07/11/2019      Recent Labs       Lab Results  Component Value Date   NA 142  07/11/2019   K 4.1 07/11/2019   CL 105 07/11/2019   CO2 21 07/11/2019   BUN 15 07/11/2019   CREATININE 0.79 07/11/2019   CALCIUM 9.3 07/11/2019      IMAGING: AS NOTED  ------  Assessment:  53 y.o. male with morbid obesity, HTN, HLD, GAD/depression, OSA, GERD, Gout who presented to OSH with 3 days of right abdominal pain. No clinic or objective GU exam concerns of infection. CT with findings of . Noted right extrarenal pelvis; normal renal function, no concern for infection on UA.    Recommendations: - No urologic intervention indicated at this time - Consult general surgery for right abdominal pain, CT scan findings of mesenteritis AGREE WITH FINDINGS AND GET GENERAL SURGERY OPINION   Thank you for this consult. Please contact the urology consult pager with any further questions/concerns.

## 2019-09-12 NOTE — Progress Notes (Signed)
Pharmacy Antibiotic Note  Hayden Hayes is a 53 y.o. male admitted on 09/12/2019 with cellulitis of panniculus, thighs, genitalia.  Pharmacy has been consulted for Vancomycin & Zosyn dosing.  Plan: Vancomycin 2500mg  x1, then 1500mg  q12,  Difficult to calculate AUC, will obtain levels at steady state (4th or 5th dose) Zosyn 3.375gm q8 (4 hr infusion) Daily SCr    Temp (24hrs), Avg:98.1 F (36.7 C), Min:98.1 F (36.7 C), Max:98.1 F (36.7 C)  Recent Labs  Lab 09/12/19 0849  WBC 5.4  CREATININE 0.81    CrCl cannot be calculated (Unknown ideal weight.).    No Known Allergies  Antimicrobials this admission: 10/29 Vancomycin >>  10/29 Zosyn >>   Dose adjustments this admission:  Microbiology results: No Cx  Thank you for allowing pharmacy to be a part of this patient's care.  Minda Ditto PharmD 09/12/2019 10:23 AM

## 2019-09-12 NOTE — Consult Note (Signed)
[facility] Nurse wound consult note Reason for Consult:Intertriginous dermatitis to inguinal folds, abdominal pannus and scrotal areas.  Self neglect, lives alone Wound type: Full thickness tissue loss to right abdominal pannus and scrotum.  Surrounding chronic skin changes to abdominal pannus, erythema and tenderness present.  Urology and surgery consulted.  Will recommend topical treatment for skin fold breakdown management.  Pressure Injury POA: Yes Measurement: generalized breakdown to skin folds in perineal and abdominal areas.  Large, pendulous abdomen with signs of trauma from friction and shear. Wound [id number] and moist Drainage (amount, consistency, odor) moderate serosanguinous  Musty odor Periwound:erythema and chronic skin changes.  Dressing procedure/placement/frequency: Cleanse perineal and abdominal skin with soap and water and pat dry.  Measure and cut length of InterDry Ag+ to fit in skin folds that have skin breakdown Tuck InterDry  Ag+ fabric into skin folds in a single layer, allow for 2 inches of overhang from skin edges to allow for wicking to occur May remove to bathe; dry area thoroughly and then tuck into affected areas again Do not apply any creams or ointments when using InterDry Ag+ DO NOT THROW AWAY FOR 5 DAYS unless soiled with stool DO NOT [facility] product, this will inactivate the silver in the material  New sheet of Interdry Ag+ should be applied after 5 days of use if patient continues to have skin breakdown   Will not follow at this time.  Please re-consult if needed.  Domenic Moras MSN, RN, FNP-BC CWON Wound, Ostomy, Continence Nurse Pager [phone]

## 2019-09-12 NOTE — ED Notes (Signed)
Pt provided food tray and set up assistance

## 2019-09-12 NOTE — ED Notes (Signed)
Urology at bedside.

## 2019-09-12 NOTE — ED Provider Notes (Addendum)
[facility] DEPT Provider Note   CSN: [id number] Arrival date & time: 09/12/19  0430     History   Chief Complaint Chief Complaint  Patient presents with  . Groin Swelling    HPI Hayden Hayes is a 53 y.o. male.     The history is provided by the patient and medical records.     53 y.o. M with hx of morbid obesity, GERD, gout, presenting to the ED from [facility] for evaluation of possible Fournier's Gangrene.  Initially went to their facility for 3 days of abdominal pain, underwent CT that was concerning for Fournier's gangrene.  Specifically CT of the pelvis with contrast with findings of diffuse skin thickening with subcutaneous edema throughout the scrotum.  No subcutaneous gas or fluid collection was noted.  Urology was consulted and transferred here for further management.  Patient has already received IV clindamycin and has vancomycin running currently.  Past Medical History:  Diagnosis Date  . GERD (gastroesophageal reflux disease)   . Gout     Patient Active Problem List   Diagnosis Date Noted  . GAD (generalized anxiety disorder) 07/11/2019  . Depression, major, single episode, moderate ([place]) 07/11/2019  . OSA on CPAP 07/11/2019  . Hypothyroidism 08/29/2018  . Morbid obesity due to excess calories ([place]) 01/28/2016  . Hyperlipidemia 04/30/2014  . Gout 04/30/2014  . Essential hypertension with goal blood pressure less than 130/80 04/30/2014    No past surgical history on file.      Home Medications    Prior to Admission medications   Medication Sig Start Date End Date Taking? Authorizing Provider  enalapril (VASOTEC) 2.5 MG tablet TAKE 1 TABLET BY MOUTH EVERY DAY 07/19/19   Evelina Dun A, FNP  levothyroxine (SYNTHROID) 175 MCG tablet TAKE ONE TABLET (175 MCG DOSE) BY MOUTH DAILY. 07/19/19   Hawks, Christy A, FNP  MITIGARE 0.6 MG CAPS Take 0.6 mg by mouth 2 (two) times daily. 06/21/19   [provider]  omeprazole  (PRILOSEC) 20 MG capsule Take 20 mg by mouth daily. 06/21/19   [provider]  QUEtiapine (SEROQUEL) 25 MG tablet Take 25 mg by mouth daily. 06/21/19   [provider]  simvastatin (ZOCOR) 20 MG tablet TAKE 2 TABLETS BY MOUTH EVERY DAY 07/19/19   Sharion Balloon, FNP    Family History Family History  Problem Relation Age of Onset  . Heart disease Mother   . Epilepsy Mother   . Heart disease Father        Heart attacks and stents in neck    Social History Social History   Tobacco Use  . Smoking status: Never Smoker  . Smokeless tobacco: Never Used  Substance Use Topics  . Alcohol use: Never    Frequency: Never  . Drug use: Never     Allergies   Patient has no known allergies.   Review of Systems Review of Systems  Gastrointestinal: Positive for abdominal pain.  Genitourinary: Positive for testicular pain.  All other systems reviewed and are negative.    Physical Exam Updated Vital Signs BP (!) 144/87   Pulse 100   Temp 98.1 F (36.7 C) (Oral)   Resp 15   SpO2 95%   Physical Exam Vitals signs and nursing note reviewed.  Constitutional:      Appearance: He is well-developed.  HENT:     Head: Normocephalic and atraumatic.  Eyes:     Conjunctiva/sclera: Conjunctivae normal.     Pupils: Pupils  are equal, round, and reactive to light.  Neck:     Musculoskeletal: Normal range of motion.  Cardiovascular:     Rate and Rhythm: Normal rate and regular rhythm.     Heart sounds: Normal heart sounds.  Pulmonary:     Effort: Pulmonary effort is normal.     Breath sounds: Normal breath sounds.  Abdominal:     General: Bowel sounds are normal.     Palpations: Abdomen is soft.  Genitourinary:    Comments: Very large panus obscuring view of genital area, visible scrotum appears edematous Further exam deferred to urology Musculoskeletal: Normal range of motion.  Skin:    General: Skin is warm and dry.  Neurological:     Mental Status: He is alert  and oriented to person, place, and time.      ED Treatments / Results  Labs (all labs ordered are listed, but only abnormal results are displayed) Labs Reviewed  SARS CORONAVIRUS 2 BY RT PCR (HOSPITAL ORDER, [place] [facility])    EKG None  Radiology No results found.  Procedures Procedures (including critical care time)  CRITICAL CARE Performed by: Larene Pickett   Total critical care time: 40 minutes  Critical care time was exclusive of separately billable procedures and treating other patients.  Critical care was necessary to treat or prevent imminent or life-threatening deterioration.  Critical care was time spent personally by me on the following activities: development of treatment plan with patient and/or surrogate as well as nursing, discussions with consultants, evaluation of patient's response to treatment, examination of patient, obtaining history from patient or surrogate, ordering and performing treatments and interventions, ordering and review of laboratory studies, ordering and review of radiographic studies, pulse oximetry and re-evaluation of patient's condition.   Medications Ordered in ED Medications  piperacillin-tazobactam (ZOSYN) IVPB 3.375 g (3.375 g Intravenous New Bag/Given 09/12/19 0517)     Initial Impression / Assessment and Plan / ED Course  I have reviewed the triage vital signs and the nursing notes.  Pertinent labs & imaging results that were available during my care of the patient were reviewed by me and considered in my medical decision making (see chart for details).  53 year old male presenting to our ER transferred from [facility] for further evaluation of possible Fortier's gangrene.  He presented to their ER due to 3 days of ongoing abdominal pain, underwent CT that was concerning for Fournier's gangrene of the scrotum.  Labs are overall reassuring with normal white count.  Patient is hemodynamically stable  here, afebrile.  He is morbidly obese with a very large pannus obscuring view of his genital area.  His visible scrotum does appear edematous.  Covid screen has been sent.  Urology was notified of patient's arrival and will evaluate in the ER.  He has received clindamycin and vancomycin, will add Zosyn.  6:23 AM Urology resident, Dr. Fredderick Phenix, has evaluated patient in ER.  Exam findings in his opinion are not matching CT read so going to review with attending , Dr. Matilde Sprang and will notify us on recommended course.  6:35 AM  Dr. Matilde Sprang to come down to evaluate patient.  Did recommend that general surgery be consulted for second opinion and review of CT as well.  Notified Dr. Rosendo Gros-- dayshift will consult.  May ultimately need medical admission for IV abx and close monitoring.  Care will be signed out to oncoming PA to follow-up on urology/general surgery recommendations and disposition.  Final Clinical Impressions(s) /  ED Diagnoses   Final diagnoses:  Fournier's gangrene    ED Discharge Orders    None       Larene Pickett, PA-C 09/12/19 [place], [place], PA-C 09/12/19 0645    Ward, Delice Bison, DO 09/12/19 [phone]

## 2019-09-12 NOTE — ED Notes (Signed)
Pharmacy messaged for missing dose.

## 2019-09-12 NOTE — ED Notes (Signed)
3 RN's assisted pt with voiding. 1023ml output clear straw yellow.

## 2019-09-13 ENCOUNTER — Inpatient Hospital Stay: Payer: Self-pay

## 2019-09-13 DIAGNOSIS — M793 Panniculitis, unspecified: Secondary | ICD-10-CM | POA: Diagnosis not present

## 2019-09-13 DIAGNOSIS — Z7401 Bed confinement status: Secondary | ICD-10-CM | POA: Diagnosis not present

## 2019-09-13 LAB — COMPREHENSIVE METABOLIC PANEL
ALT: 17 U/L (ref 0–44)
AST: 16 U/L (ref 15–41)
Albumin: 3.2 g/dL — ABNORMAL LOW (ref 3.5–5.0)
Alkaline Phosphatase: 48 U/L (ref 38–126)
Anion gap: 6 (ref 5–15)
BUN: 8 mg/dL (ref 6–20)
CO2: 23 mmol/L (ref 22–32)
Calcium: 8.4 mg/dL — ABNORMAL LOW (ref 8.9–10.3)
Chloride: 110 mmol/L (ref 98–111)
Creatinine, Ser: 0.75 mg/dL (ref 0.61–1.24)
GFR calc Af Amer: >60 mL/min (ref >60)
GFR calc non Af Amer: >60 mL/min (ref >60)
Glucose, Bld: 101 mg/dL — ABNORMAL HIGH (ref 70–99)
Potassium: 3.5 mmol/L (ref 3.5–5.1)
Sodium: 139 mmol/L (ref 135–145)
Total Bilirubin: 1.3 mg/dL — ABNORMAL HIGH (ref 0.3–1.2)
Total Protein: 6.5 g/dL (ref 6.5–8.1)

## 2019-09-13 LAB — CBC
HCT: 35.3 % — ABNORMAL LOW (ref 39.0–52.0)
Hemoglobin: 11.6 g/dL — ABNORMAL LOW (ref 13.0–17.0)
MCH: 30.7 pg (ref 26.0–34.0)
MCHC: 32.9 g/dL (ref 30.0–36.0)
MCV: 93.4 fL (ref 80.0–100.0)
Platelets: 212 10*3/uL (ref 150–400)
RBC: 3.78 MIL/uL — ABNORMAL LOW (ref 4.22–5.81)
RDW: 14.4 % (ref 11.5–15.5)
WBC: 5.3 10*3/uL (ref 4.0–10.5)
nRBC: 0 % (ref 0.0–0.2)

## 2019-09-13 NOTE — Progress Notes (Signed)
PROGRESS NOTE    Hayden Hayes  [id number] DOB: May 12, 1966 DOA: 09/12/2019 PCP: Sharion Balloon, FNP  Outpatient Specialists:   Brief Narrative:  Patient is a 53 year old male with past medical history significant for morbid obesity, hypothyroidism, sleep apnea, GERD, hypertension and gout.  Patient was transferred from [facility] ER.  Patient presented with 3-day history of abdominal pain.  CT scan revealed subcutaneous stranding and fatty infiltration along the right lateral abdominal wall and soft tissue thickening of fluid along fascial planes, rectal wall thickening, mesenteric stranding, mild perinephric stranding.    Patient has been seen by the general surgery and urology, Fournier's gangrene was ruled out.  No surgical intervention recommended.  Patient is currently on IV vancomycin and Zosyn.   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   -Surgical input is appreciated-no need for surgery at this time.    Hypokalemia: -Resolving.  Potassium was 3.5 today.   Hypothyroidism: -continue Synthroid.  Hyperlipidemia: -continue Zocor  Gout:  -Stable.     DVT prophylaxis: Subcutaneous Lovenox Code Status: Full code Family Communication:  Disposition Plan: Home eventually   Consultants:   Surgical team.    Urology team.  Procedures:   None  Antimicrobials:   IV vancomycin  IV Zosyn.   Subjective: No new complaint. Cellulitis is improving.  Objective: Vitals:   09/12/19 1520 09/12/19 1615 09/12/19 2101 09/13/19 0513  BP: (!) 151/79 (!) 165/89 (!) 145/78 111/65  Pulse: 92 92 96 80  Resp: 18 18 20 18   Temp:  98.1 F (36.7 C) 99.1 F (37.3 C) 98.1 F (36.7 C)  TempSrc:   Oral Oral  SpO2: 94% 96% 97% 96%  Weight:      Height:        Intake/Output Summary (Last 24 hours) at 09/13/2019 1214 Last  data filed at 09/13/2019 0551 Gross per 24 hour  Intake 1355.17 ml  Output 3550 ml  Net -2194.83 ml   Filed Weights   09/12/19 1245  Weight: (!) 181.4 kg    Examination:  General exam: Appears calm and comfortable  Respiratory system: Clear to auscultation. Respiratory effort normal. Cardiovascular system: S1 & S2 heard, RRR. No JVD, murmurs, rubs, gallops or clicks. No pedal edema. Gastrointestinal system: Abdomen is nondistended, soft and nontender. No organomegaly or masses felt. Normal bowel sounds heard. Central nervous system: Alert and oriented. No focal neurological deficits. Extremities: Symmetric 5 x 5 power. Skin: No rashes, lesions or ulcers Psychiatry: Judgement and insight appear normal. Mood & affect appropriate.     Data Reviewed: I have personally reviewed following labs and imaging studies  CBC: Recent Labs  Lab 09/12/19 0849 09/12/19 1655 09/13/19 0249  WBC 5.4 5.1 5.3  NEUTROABS 3.5  --   --   HGB 12.5* 12.1* 11.6*  HCT 37.8* 36.3* 35.3*  MCV 92.6 92.6 93.4  PLT 211 213 [id number]   Basic Metabolic Panel: Recent Labs  Lab 09/12/19 0849 09/12/19 1655 09/13/19 0249  NA 137  --  139  K 3.3*  --  3.5  CL 106  --  110  CO2 24  --  23  GLUCOSE 106*  --  101*  BUN 11  --  8  CREATININE 0.81 0.80 0.75  CALCIUM 8.9  --  8.4*   GFR: Estimated Creatinine Clearance: 161.4 mL/min (by C-G formula based on SCr of  0.75 mg/dL). Liver Function Tests: Recent Labs  Lab 09/13/19 0249  AST 16  ALT 17  ALKPHOS 48  BILITOT 1.3*  PROT 6.5  ALBUMIN 3.2*   No results for input(s): LIPASE, AMYLASE in the last 168 hours. No results for input(s): AMMONIA in the last 168 hours. Coagulation Profile: No results for input(s): INR, PROTIME in the last 168 hours. Cardiac Enzymes: No results for input(s): CKTOTAL, CKMB, CKMBINDEX, TROPONINI in the last 168 hours. BNP (last 3 results) No results for input(s): PROBNP in the last 8760 hours. HbA1C: No results for  input(s): HGBA1C in the last 72 hours. CBG: No results for input(s): GLUCAP in the last 168 hours. Lipid Profile: No results for input(s): CHOL, HDL, LDLCALC, TRIG, CHOLHDL, LDLDIRECT in the last 72 hours. Thyroid Function Tests: No results for input(s): TSH, T4TOTAL, FREET4, T3FREE, THYROIDAB in the last 72 hours. Anemia Panel: No results for input(s): VITAMINB12, FOLATE, FERRITIN, TIBC, IRON, RETICCTPCT in the last 72 hours. Urine analysis: No results found for: COLORURINE, APPEARANCEUR, LABSPEC, PHURINE, GLUCOSEU, HGBUR, BILIRUBINUR, KETONESUR, PROTEINUR, UROBILINOGEN, NITRITE, LEUKOCYTESUR Sepsis Labs: @LABRCNTIP (procalcitonin:4,lacticidven:4)  ) Recent Results (from the past 240 hour(s))  SARS Coronavirus 2 by RT PCR (hospital order, performed in [facility] hospital lab) Nasopharyngeal Nasopharyngeal Swab     Status: None   Collection Time: 09/12/19  4:42 AM   Specimen: Nasopharyngeal Swab  Result Value Ref Range Status   SARS Coronavirus 2 NEGATIVE NEGATIVE Final    Comment: (NOTE) If result is NEGATIVE SARS-CoV-2 target nucleic acids are NOT DETECTED. The SARS-CoV-2 RNA is generally detectable in upper and lower  respiratory specimens during the acute phase of infection. The lowest  concentration of SARS-CoV-2 viral copies this assay can detect is 250  copies / mL. A negative result does not preclude SARS-CoV-2 infection  and should not be used as the sole basis for treatment or other  patient management decisions.  A negative result may occur with  improper specimen collection / handling, submission of specimen other  than nasopharyngeal swab, presence of viral mutation(s) within the  areas targeted by this assay, and inadequate number of viral copies  (<250 copies / mL). A negative result must be combined with clinical  observations, patient history, and epidemiological information. If result is POSITIVE SARS-CoV-2 target nucleic acids are DETECTED. The SARS-CoV-2 RNA  is generally detectable in upper and lower  respiratory specimens dur ing the acute phase of infection.  Positive  results are indicative of active infection with SARS-CoV-2.  Clinical  correlation with patient history and other diagnostic information is  necessary to determine patient infection status.  Positive results do  not rule out bacterial infection or co-infection with other viruses. If result is PRESUMPTIVE POSTIVE SARS-CoV-2 nucleic acids MAY BE PRESENT.   A presumptive positive result was obtained on the submitted specimen  and confirmed on repeat testing.  While 2019 novel coronavirus  (SARS-CoV-2) nucleic acids may be present in the submitted sample  additional confirmatory testing may be necessary for epidemiological  and / or clinical management purposes  to differentiate between  SARS-CoV-2 and other Sarbecovirus currently known to infect humans.  If clinically indicated additional testing with an alternate test  methodology [phone]) is advised. The SARS-CoV-2 RNA is generally  detectable in upper and lower respiratory sp ecimens during the acute  phase of infection. The expected result is Negative. Fact Sheet for Patients:  StrictlyIdeas.no Fact Sheet for Healthcare Providers: BankingDealers.co.za This test is not yet approved or cleared by  the [employer] and has been authorized for detection and/or diagnosis of SARS-CoV-2 by FDA under an Emergency Use Authorization (EUA).  This EUA will remain in effect (meaning this test can be used) for the duration of the COVID-19 declaration under Section 564(b)(1) of the Act, 21 U.S.C. section 360bbb-3(b)(1), unless the authorization is terminated or revoked sooner. Performed at [facility], [place] [address], [place], [place] [address]          Radiology Studies: Ct Outside Films Body/abd/pelvis  Result Date: 09/13/2019 This examination belongs  to an outside facility and is stored here for comparison purposes only.  Contact the originating outside institution for any associated report or interpretation.       Scheduled Meds: . colchicine  0.6 mg Oral BID  . enalapril  2.5 mg Oral Daily  . enoxaparin (LOVENOX) injection  90 mg Subcutaneous Q24H  . levothyroxine  175 mcg Oral Q0600  . pantoprazole  40 mg Oral Daily  . QUEtiapine  25 mg Oral Daily  . simvastatin  40 mg Oral Daily   Continuous Infusions: . sodium chloride 10 mL/hr at 09/12/19 1836  . piperacillin-tazobactam (ZOSYN)  IV 3.375 g (09/13/19 0840)  . vancomycin 1,500 mg (09/12/19 2348)     LOS: 1 day    Time spent: 25 minutes    Dana Allan, MD  [facility] Pager #: [phone] 7PM-7AM contact night coverage as above

## 2019-09-13 NOTE — Progress Notes (Signed)
    [id number]  Subjective: No real change since yesterday.  Comfortable on CPAP  Objective: Vital signs in last 24 hours: Temp:  [98.1 F (36.7 C)-99.1 F (37.3 C)] 98.1 F (36.7 C) (10/30 0513) Pulse Rate:  [80-100] 80 (10/30 0513) Resp:  [16-20] 18 (10/30 0513) BP: (111-176)/(65-106) 111/65 (10/30 0513) SpO2:  [93 %-98 %] 96 % (10/30 0513) Weight:  [181.4 kg] 181.4 kg (10/29 1245) Last BM Date: 09/09/19  Intake/Output from previous day: 10/29 0701 - 10/30 0700 In: 1355.2 [P.O.:240; I.V.:0.4; IV Piggyback:1114.8] Out: [id number] [Urine:3550] Intake/Output this shift: No intake/output data recorded.  General appearance: alert, cooperative and no distress Skin: Skin color, texture, turgor normal. No rashes or lesions or Cellulitis is about the same, no skin necrosis that I can see.  He has the inter dry between panus and thighs.    Lab Results:  Recent Labs    09/12/19 1655 09/13/19 0249  WBC 5.1 5.3  HGB 12.1* 11.6*  HCT 36.3* 35.3*  PLT 213 212    BMET Recent Labs    09/12/19 0849 09/12/19 1655 09/13/19 0249  NA 137  --  139  K 3.3*  --  3.5  CL 106  --  110  CO2 24  --  23  GLUCOSE 106*  --  101*  BUN 11  --  8  CREATININE 0.81 0.80 0.75  CALCIUM 8.9  --  8.4*   PT/INR No results for input(s): LABPROT, INR in the last 72 hours.  Recent Labs  Lab 09/13/19 0249  AST 16  ALT 17  ALKPHOS 48  BILITOT 1.3*  PROT 6.5  ALBUMIN 3.2*     Lipase  No results found for: LIPASE   Medications: . colchicine  0.6 mg Oral BID  . enalapril  2.5 mg Oral Daily  . enoxaparin (LOVENOX) injection  90 mg Subcutaneous Q24H  . levothyroxine  175 mcg Oral Q0600  . pantoprazole  40 mg Oral Daily  . QUEtiapine  25 mg Oral Daily  . simvastatin  40 mg Oral Daily    Assessment/Plan Essential hypertension Hypothyroid Hyperlipidemia Gout Generalized anxiety disorder Obstructive sleep apnea on CPAP  Cellulitis of the panniculus, right and left  thighs. Probable proctitis based on CT scan and history Morbid obesity - BMI 68.66  FEN: regular diet ID:  Zosyn 10/29 >> day 2 DVT:  Lovenox Follow up:  TBD  Plan:  No surgical issues currently.  We will see again on Monday, call if the is a new development or issue.    LOS: 1 day    Ranald Alessio 09/13/2019 Please see Amion

## 2019-09-14 DIAGNOSIS — M793 Panniculitis, unspecified: Secondary | ICD-10-CM | POA: Diagnosis not present

## 2019-09-14 LAB — RENAL FUNCTION PANEL
Albumin: 3.4 g/dL — ABNORMAL LOW (ref 3.5–5.0)
Anion gap: 8 (ref 5–15)
BUN: 10 mg/dL (ref 6–20)
CO2: 23 mmol/L (ref 22–32)
Calcium: 8.7 mg/dL — ABNORMAL LOW (ref 8.9–10.3)
Chloride: 108 mmol/L (ref 98–111)
Creatinine, Ser: 0.93 mg/dL (ref 0.61–1.24)
GFR calc Af Amer: >60 mL/min (ref >60)
GFR calc non Af Amer: >60 mL/min (ref >60)
Glucose, Bld: 108 mg/dL — ABNORMAL HIGH (ref 70–99)
Phosphorus: 4 mg/dL (ref 2.5–4.6)
Potassium: 3.7 mmol/L (ref 3.5–5.1)
Sodium: 139 mmol/L (ref 135–145)

## 2019-09-14 LAB — MAGNESIUM: Magnesium: 2.2 mg/dL (ref 1.7–2.4)

## 2019-09-14 LAB — CBC WITH DIFFERENTIAL/PLATELET
Abs Immature Granulocytes: 0.03 10*3/uL (ref 0.00–0.07)
Basophils Absolute: 0 10*3/uL (ref 0.0–0.1)
Basophils Relative: 0 %
Eosinophils Absolute: 0.3 10*3/uL (ref 0.0–0.5)
Eosinophils Relative: 5 %
HCT: 36.4 % — ABNORMAL LOW (ref 39.0–52.0)
Hemoglobin: 11.8 g/dL — ABNORMAL LOW (ref 13.0–17.0)
Immature Granulocytes: 1 %
Lymphocytes Relative: 27 %
Lymphs Abs: 1.4 10*3/uL (ref 0.7–4.0)
MCH: 30.4 pg (ref 26.0–34.0)
MCHC: 32.4 g/dL (ref 30.0–36.0)
MCV: 93.8 fL (ref 80.0–100.0)
Monocytes Absolute: 0.4 10*3/uL (ref 0.1–1.0)
Monocytes Relative: 8 %
Neutro Abs: 3.1 10*3/uL (ref 1.7–7.7)
Neutrophils Relative %: 59 %
Platelets: 223 10*3/uL (ref 150–400)
RBC: 3.88 MIL/uL — ABNORMAL LOW (ref 4.22–5.81)
RDW: 14.5 % (ref 11.5–15.5)
WBC: 5.2 10*3/uL (ref 4.0–10.5)
nRBC: 0 % (ref 0.0–0.2)

## 2019-09-14 LAB — CREATININE, SERUM
Creatinine, Ser: 0.83 mg/dL (ref 0.61–1.24)
GFR calc Af Amer: >60 mL/min (ref >60)
GFR calc non Af Amer: >60 mL/min (ref >60)

## 2019-09-14 NOTE — Progress Notes (Signed)
PROGRESS NOTE    Hayden Hayes  [id number] DOB: 1966/03/05 DOA: 09/12/2019 PCP: Sharion Balloon, FNP  Outpatient Specialists:   Brief Narrative:  Patient is a 53 year old male with past medical history significant for morbid obesity, hypothyroidism, sleep apnea, GERD, hypertension and gout.  Patient was transferred from [facility] ER.  Patient presented with 3-day history of abdominal pain.  CT scan revealed subcutaneous stranding and fatty infiltration along the right lateral abdominal wall and soft tissue thickening of fluid along fascial planes, rectal wall thickening, mesenteric stranding, mild perinephric stranding.    Patient has been seen by the general surgery and urology, Fournier's gangrene was ruled out.  No surgical intervention recommended.  Patient is currently on IV vancomycin and Zosyn.  09/14/2019: Patient seen.  No new complaints.  Patient continues to improve.  Will consult PT OT.  Continue antibiotics for now.  PT OT evaluation will influence disposition.  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   -Surgical input is appreciated-no need for surgery at this time.    Hypokalemia: -Resolved.   Hypothyroidism: -continue Synthroid.  Hyperlipidemia: -continue Zocor  Gout:  -Stable.     DVT prophylaxis: Subcutaneous Lovenox Code Status: Full code Family Communication:  Disposition Plan: Await PT OT evaluation.   Consultants:   Surgical team.    Urology team.  Procedures:   None  Antimicrobials:   IV vancomycin  IV Zosyn.   Subjective: No new complaint. Cellulitis is improving.  Objective: Vitals:   09/13/19 0513 09/13/19 1414 09/13/19 2045 09/14/19 0556  BP: 111/65 133/74 135/84 (!) 150/86  Pulse: 80 77 85 88  Resp: 18 18 18 16   Temp: 98.1 F (36.7 C) 99.1 F (37.3 C) 98.2 F (36.8  C) 98.5 F (36.9 C)  TempSrc: Oral Oral Oral Oral  SpO2: 96% 98% 97% 98%  Weight:      Height:        Intake/Output Summary (Last 24 hours) at 09/14/2019 1045 Last data filed at 09/14/2019 0557 Gross per 24 hour  Intake 1925.58 ml  Output 2700 ml  Net -774.42 ml   Filed Weights   09/12/19 1245  Weight: (!) 181.4 kg    Examination:  General exam: Appears calm and comfortable  Respiratory system: Clear to auscultation. Respiratory effort normal. Cardiovascular system: S1 & S2 heard, RRR. No JVD, murmurs, rubs, gallops or clicks. No pedal edema. Gastrointestinal system: Abdomen is nondistended, soft and nontender. No organomegaly or masses felt. Normal bowel sounds heard. Central nervous system: Alert and oriented. No focal neurological deficits. Extremities: Symmetric 5 x 5 power. Skin: No rashes, lesions or ulcers Psychiatry: Judgement and insight appear normal. Mood & affect appropriate.     Data Reviewed: I have personally reviewed following labs and imaging studies  CBC: Recent Labs  Lab 09/12/19 0849 09/12/19 1655 09/13/19 0249 09/14/19 0331  WBC 5.4 5.1 5.3 5.2  NEUTROABS 3.5  --   --  3.1  HGB 12.5* 12.1* 11.6* 11.8*  HCT 37.8* 36.3* 35.3* 36.4*  MCV 92.6 92.6 93.4 93.8  PLT 211 213 212 [id number]   Basic Metabolic Panel: Recent Labs  Lab 09/12/19 0849 09/12/19 1655 09/13/19 0249 09/14/19 0331  NA 137  --  139 139  K 3.3*  --  3.5 3.7  CL 106  --  110 108  CO2 24  --  23 23  GLUCOSE 106*  --  101* 108*  BUN 11  --  8 10  CREATININE 0.81 0.80 0.75 0.93  0.83  CALCIUM 8.9  --  8.4* 8.7*  MG  --   --   --  2.2  PHOS  --   --   --  4.0   GFR: Estimated Creatinine Clearance: 155.6 mL/min (by C-G formula based on SCr of 0.83 mg/dL). Liver Function Tests: Recent Labs  Lab 09/13/19 0249 09/14/19 0331  AST 16  --   ALT 17  --   ALKPHOS 48  --   BILITOT 1.3*  --   PROT 6.5  --   ALBUMIN 3.2* 3.4*   No results for input(s): LIPASE, AMYLASE in the  last 168 hours. No results for input(s): AMMONIA in the last 168 hours. Coagulation Profile: No results for input(s): INR, PROTIME in the last 168 hours. Cardiac Enzymes: No results for input(s): CKTOTAL, CKMB, CKMBINDEX, TROPONINI in the last 168 hours. BNP (last 3 results) No results for input(s): PROBNP in the last 8760 hours. HbA1C: No results for input(s): HGBA1C in the last 72 hours. CBG: No results for input(s): GLUCAP in the last 168 hours. Lipid Profile: No results for input(s): CHOL, HDL, LDLCALC, TRIG, CHOLHDL, LDLDIRECT in the last 72 hours. Thyroid Function Tests: No results for input(s): TSH, T4TOTAL, FREET4, T3FREE, THYROIDAB in the last 72 hours. Anemia Panel: No results for input(s): VITAMINB12, FOLATE, FERRITIN, TIBC, IRON, RETICCTPCT in the last 72 hours. Urine analysis: No results found for: COLORURINE, APPEARANCEUR, LABSPEC, PHURINE, GLUCOSEU, HGBUR, BILIRUBINUR, KETONESUR, PROTEINUR, UROBILINOGEN, NITRITE, LEUKOCYTESUR Sepsis Labs: @LABRCNTIP (procalcitonin:4,lacticidven:4)  ) Recent Results (from the past 240 hour(s))  SARS Coronavirus 2 by RT PCR (hospital order, performed in [facility] hospital lab) Nasopharyngeal Nasopharyngeal Swab     Status: None   Collection Time: 09/12/19  4:42 AM   Specimen: Nasopharyngeal Swab  Result Value Ref Range Status   SARS Coronavirus 2 NEGATIVE NEGATIVE Final    Comment: (NOTE) If result is NEGATIVE SARS-CoV-2 target nucleic acids are NOT DETECTED. The SARS-CoV-2 RNA is generally detectable in upper and lower  respiratory specimens during the acute phase of infection. The lowest  concentration of SARS-CoV-2 viral copies this assay can detect is 250  copies / mL. A negative result does not preclude SARS-CoV-2 infection  and should not be used as the sole basis for treatment or other  patient management decisions.  A negative result may occur with  improper specimen collection / handling, submission of specimen other   than nasopharyngeal swab, presence of viral mutation(s) within the  areas targeted by this assay, and inadequate number of viral copies  (<250 copies / mL). A negative result must be combined with clinical  observations, patient history, and epidemiological information. If result is POSITIVE SARS-CoV-2 target nucleic acids are DETECTED. The SARS-CoV-2 RNA is generally detectable in upper and lower  respiratory specimens dur ing the acute phase of infection.  Positive  results are indicative of active infection with SARS-CoV-2.  Clinical  correlation with patient history and other diagnostic information is  necessary to determine patient infection status.  Positive results do  not rule out bacterial infection or co-infection with other viruses. If result is PRESUMPTIVE POSTIVE SARS-CoV-2 nucleic acids MAY BE PRESENT.   A presumptive positive result was obtained on the submitted specimen  and confirmed on repeat testing.  While 2019 novel coronavirus  (SARS-CoV-2) nucleic acids may be present in the submitted sample  additional confirmatory testing  may be necessary for epidemiological  and / or clinical management purposes  to differentiate between  SARS-CoV-2 and other Sarbecovirus currently known to infect humans.  If clinically indicated additional testing with an alternate test  methodology [phone]) is advised. The SARS-CoV-2 RNA is generally  detectable in upper and lower respiratory sp ecimens during the acute  phase of infection. The expected result is Negative. Fact Sheet for Patients:  StrictlyIdeas.no Fact Sheet for Healthcare Providers: BankingDealers.co.za This test is not yet approved or cleared by the [place] FDA and has been authorized for detection and/or diagnosis of SARS-CoV-2 by FDA under an Emergency Use Authorization (EUA).  This EUA will remain in effect (meaning this test can be used) for the duration of the  COVID-19 declaration under Section 564(b)(1) of the Act, 21 U.S.C. section 360bbb-3(b)(1), unless the authorization is terminated or revoked sooner. Performed at [facility], [place] [address], [place], [place] [address]          Radiology Studies: No results found.      Scheduled Meds: . colchicine  0.6 mg Oral BID  . enalapril  2.5 mg Oral Daily  . enoxaparin (LOVENOX) injection  90 mg Subcutaneous Q24H  . levothyroxine  175 mcg Oral Q0600  . pantoprazole  40 mg Oral Daily  . QUEtiapine  25 mg Oral Daily  . simvastatin  40 mg Oral Daily   Continuous Infusions: . sodium chloride 10 mL/hr at 09/12/19 1836  . piperacillin-tazobactam (ZOSYN)  IV 3.375 g (09/14/19 [id number])  . vancomycin 1,500 mg (09/14/19 0017)     LOS: 2 days    Time spent: 25 minutes    Dana Allan, MD  [facility] Pager #: [phone] 7PM-7AM contact night coverage as above

## 2019-09-15 LAB — CREATININE, SERUM
Creatinine, Ser: 0.96 mg/dL (ref 0.61–1.24)
GFR calc Af Amer: >60 mL/min (ref >60)
GFR calc non Af Amer: >60 mL/min (ref >60)

## 2019-09-15 LAB — VANCOMYCIN, PEAK: Vancomycin Pk: 38 ug/mL (ref 30–40)

## 2019-09-15 MED ORDER — AMOXICILLIN-POT CLAVULANATE 875-125 MG PO TABS: 1.0000 | ORAL_TABLET | Freq: Two times a day (BID) | ORAL | 0 refills | Status: AC

## 2019-09-15 MED ORDER — DOXYCYCLINE HYCLATE 100 MG PO TABS: 100.0000 mg | ORAL_TABLET | Freq: Two times a day (BID) | ORAL | Status: AC

## 2019-09-15 NOTE — Progress Notes (Signed)
Pharmacy Antibiotic Note  Hayden Hayes is a 53 y.o. male who was transferred from [facility] to [facility] on 09/12/2019 for workup and management of suspected Fournier's Gangrene.  He was subsequently found to have extensive cellulitis of the panniculus of both right and left things and suspected proctitis.  Patient's currently on vancomycin and zosyn for infection.  Today, 09/15/2019: - afeb, wbc wnl - scr trending up with 0.96 this morning (crcl~100)  Plan: - continue with vancomycin 1500 mg IV q12h - will check vancomycin peak and trough levels today to assess current regimen. Will f/u and adjust dose if needed - continue zosyn 3.375gm IV q8h (infuse over 4 hrs) - f/u renal function closely ________________________________________  Height: 5\' 4"  (162.6 cm) Weight: (!) 400 lb (181.4 kg) IBW/kg (Calculated) : 59.2  Temp (24hrs), Avg:98.5 F (36.9 C), Min:98.2 F (36.8 C), Max:98.7 F (37.1 C)  Recent Labs  Lab 09/12/19 0849 09/12/19 1655 09/13/19 0249 09/14/19 0331 09/15/19 0313  WBC 5.4 5.1 5.3 5.2  --   CREATININE 0.81 0.80 0.75 0.93  0.83 0.96    Estimated Creatinine Clearance: 134.5 mL/min (by C-G formula based on SCr of 0.96 mg/dL).    No Known Allergies   Antimicrobials this admission: 10/29 Vancomycin >>  10/29 Zosyn >>    Microbiology results: No Cx  Thank you for allowing pharmacy to be a part of this patient's care.  Lynelle Doctor 09/15/2019 9:51 AM

## 2019-09-15 NOTE — Plan of Care (Signed)
°  Problem: Clinical Measurements: °Goal: Ability to maintain clinical measurements within normal limits will improve °Outcome: Progressing °Goal: Will remain free from infection °Outcome: Progressing °Goal: Diagnostic test results will improve °Outcome: Progressing °Goal: Respiratory complications will improve °Outcome: Progressing °  °

## 2019-09-15 NOTE — Plan of Care (Signed)
Pt. Was seen for skilled OT evaluation with PT for safety. Patient is morbidly obese and has decreased ability to perform ADLs and mobility. Pt. Is limited with pain in groin and was only able to perform stand pivot transfer to char. Pt. Unable to perform LE ADLs secondary to pain. Pt. Would benefit from skilled OT to maximize I and safety. Pt. Is an agreement with continued OT.

## 2019-09-15 NOTE — Evaluation (Signed)
Occupational Therapy Evaluation Patient Details Name: Hayden Hayes MRN: [id number] DOB: [birth year] Today's Date: 09/15/2019    History of Present Illness Hayden Hayes is a 53 y.o. male who was transferred from [facility] to [facility] on 09/12/2019 for workup and management of suspected Fournier's Gangrene.  He was subsequently found to have extensive cellulitis of the panniculus of both right and left things and suspected proctitis.    Clinical Impression   Pt. Was seen for skilled OT evaluation with PT for safety. Patient is morbidly obese and has decreased ability to perform ADLs and mobility. Pt. Is limited with pain in groin and was only able to perform stand pivot transfer to char. Pt. Unable to perform LE ADLs secondary to pain. Pt. Would benefit from skilled OT to maximize I and safety. Pt. Is an agreement with continued OT.     Follow Up Recommendations  Home health OT    Equipment Recommendations  3 in 1 bedside commode(bariatric)    Recommendations for Other Services       Precautions / Restrictions Precautions Precautions: Fall Restrictions Weight Bearing Restrictions: No      Mobility Bed Mobility Overal bed mobility: Needs Assistance Bed Mobility: Supine to Sit           General bed mobility comments: Pt. Min A with scooting to EOB  Transfers Overall transfer level: Needs assistance   Transfers: Stand Pivot Transfers   Stand pivot transfers: Min guard;+2 physical assistance       General transfer comment: Pt. mobility is limited secondry to pain in groin    Balance                                           ADL either performed or assessed with clinical judgement   ADL Overall ADL's : Needs assistance/impaired Eating/Feeding: Independent   Grooming: Wash/dry hands;Set up   Upper Body Bathing: Minimal assistance;Sitting   Lower Body Bathing: Sit to/from stand;Maximal assistance   Upper Body Dressing : Minimal  assistance;Sitting   Lower Body Dressing: Total assistance;Sit to/from stand   Toilet Transfer: Min guard;+2 for safety/equipment   Toileting- Water quality scientist and Hygiene: Moderate assistance       Functional mobility during ADLs: Min guard General ADL Comments: Pt. is having pain in groin area that is limiting his performance with LE ADLs.      Vision Baseline Vision/History: Wears glasses Wears Glasses: At all times       Perception     Praxis      Pertinent Vitals/Pain       Hand Dominance Right   Extremity/Trunk Assessment Upper Extremity Assessment Upper Extremity Assessment: Overall WFL for tasks assessed           Communication Communication Communication: No difficulties   Cognition Arousal/Alertness: Awake/alert Behavior During Therapy: WFL for tasks assessed/performed Overall Cognitive Status: Within Functional Limits for tasks assessed                                     General Comments       Exercises     Shoulder Instructions      Home Living Family/patient expects to be discharged to:: Private residence Living Arrangements: Alone   Type of Home: Apartment Home Access: Level entry  Bathroom Shower/Tub: Teacher, early years/pre: Handicapped height Bathroom Accessibility: Yes   Home Equipment: Grab bars - toilet;Grab bars - tub/shower          Prior Functioning/Environment Level of Independence: Independent(I WITH ADLS AND ADL TRANSFERS. PATIENT SISTER SHOPS.)                 OT Problem List: Decreased activity tolerance;Decreased knowledge of use of DME or AE      OT Treatment/Interventions: Self-care/ADL training;DME and/or AE instruction;Therapeutic activities;Patient/family education    OT Goals(Current goals can be found in the care plan section) Acute Rehab OT Goals Patient Stated Goal: go home OT Goal Formulation: With patient Time For Goal Achievement:  09/29/19 Potential to Achieve Goals: Good  OT Frequency: Min 2X/week   Barriers to D/C: (Pt. reports that he will be staying with family when he d/c)          Co-evaluation PT/OT/SLP Co-Evaluation/Treatment: Yes Reason for Co-Treatment: For patient/therapist safety PT goals addressed during session: Mobility/safety with mobility OT goals addressed during session: ADL's and self-care      AM-PAC OT "6 Clicks" Daily Activity     Outcome Measure Help from another person eating meals?: None Help from another person taking care of personal grooming?: A Little Help from another person toileting, which includes using toliet, bedpan, or urinal?: A Lot Help from another person bathing (including washing, rinsing, drying)?: A Lot Help from another person to put on and taking off regular upper body clothing?: A Little Help from another person to put on and taking off regular lower body clothing?: A Lot 6 Click Score: 16   End of Session Equipment Utilized During Treatment: Rolling walker Nurse Communication: (ok therapy)  Activity Tolerance: No increased pain Patient left: in chair;with call bell/phone within reach;with nursing/sitter in room  OT Visit Diagnosis: Unsteadiness on feet (R26.81)                Time: [id number] OT Time Calculation (min): 30 min Charges:  OT General Charges $OT Visit: 1 Visit OT Evaluation $OT Eval Low Complexity: 1 Low  6 clicks  Hayden Hayes 09/15/2019, 2:35 PM

## 2019-09-15 NOTE — Evaluation (Signed)
Physical Therapy Evaluation Patient Details Name: Hayden Hayes MRN: [id number] DOB: [birth year] Today's Date: 09/15/2019   History of Present Illness  Hayden Hayes is a 53 y.o. male who was transferred from [facility] to [facility] on 09/12/2019 for workup and management of suspected Fournier's Gangrene.  He was subsequently found to have extensive cellulitis of the panniculus of both right and left things and suspected proctitis.   Clinical Impression  Pt admitted as above and presenting with functional mobility limitations 2* obesity, pain and scrotal edema.  This date, RN reports pt has been reluctant to participate with nursing but is agreeable to attempt to mobilize with PT/OT.  Pt tolerated up to EOB, standing and short shuffling steps from bed to recliner - limited by ongoing scrotal edema and groin pain.    Follow Up Recommendations Home health PT;No PT follow up(dependent on acute stay progress)    Equipment Recommendations  (TBD)    Recommendations for Other Services       Precautions / Restrictions Precautions Precautions: Fall Restrictions Weight Bearing Restrictions: No      Mobility  Bed Mobility Overal bed mobility: Needs Assistance Bed Mobility: Supine to Sit           General bed mobility comments: Pt. Min A with scooting to EOB  Transfers Overall transfer level: Needs assistance   Transfers: Sit to/from Stand;Stand Pivot Transfers Sit to Stand: Min guard Stand pivot transfers: Min guard;+2 physical assistance       General transfer comment: Pt. mobility is limited by scrotal edema and groin pain  Ambulation/Gait Ambulation/Gait assistance: Min guard Gait Distance (Feet): 3 Feet Assistive device: (pt stabilizing with bed rail, RW handle and chair) Gait Pattern/deviations: Step-to pattern;Decreased step length - right;Decreased step length - left;Shuffle Gait velocity: decr   General Gait Details: pt side-step shuffled from bedside to chair  utilizing wide BOS and touching on bedrail, RW handle and chair for security.  Distance ltd by groing pain  Stairs            Wheelchair Mobility    Modified Rankin (Stroke Patients Only)       Balance Overall balance assessment: Needs assistance Sitting-balance support: No upper extremity supported;Feet supported Sitting balance-Leahy Scale: Good     Standing balance support: No upper extremity supported Standing balance-Leahy Scale: Fair                               Pertinent Vitals/Pain      Home Living Family/patient expects to be discharged to:: Private residence Living Arrangements: Alone   Type of Home: Apartment Home Access: Level entry     Home Layout: One level Home Equipment: Grab bars - toilet;Grab bars - tub/shower Additional Comments: Pt states he plans to "go home first and then to my sisters"    Prior Function Level of Independence: Independent               Hand Dominance   Dominant Hand: Right    Extremity/Trunk Assessment   Upper Extremity Assessment Upper Extremity Assessment: Overall WFL for tasks assessed    Lower Extremity Assessment Lower Extremity Assessment: Overall WFL for tasks assessed       Communication   Communication: No difficulties  Cognition Arousal/Alertness: Awake/alert Behavior During Therapy: WFL for tasks assessed/performed;Impulsive Overall Cognitive Status: Within Functional Limits for tasks assessed  General Comments      Exercises     Assessment/Plan    PT Assessment Patient needs continued PT services  PT Problem List Decreased activity tolerance;Decreased balance;Decreased mobility;Decreased knowledge of use of DME;Obesity;Pain       PT Treatment Interventions DME instruction;Gait training;Stair training;Functional mobility training;Therapeutic activities;Therapeutic exercise;Balance training    PT Goals (Current  goals can be found in the Care Plan section)  Acute Rehab PT Goals Patient Stated Goal: go home PT Goal Formulation: With patient Time For Goal Achievement: 09/29/19 Potential to Achieve Goals: Good    Frequency Min 3X/week   Barriers to discharge Decreased caregiver support Home alone but states he will go to his sisters "after I go home first"    Co-evaluation PT/OT/SLP Co-Evaluation/Treatment: Yes Reason for Co-Treatment: For patient/therapist safety PT goals addressed during session: Mobility/safety with mobility OT goals addressed during session: ADL's and self-care       AM-PAC PT "6 Clicks" Mobility  Outcome Measure Help needed turning from your back to your side while in a flat bed without using bedrails?: A Little Help needed moving from lying on your back to sitting on the side of a flat bed without using bedrails?: A Little Help needed moving to and from a bed to a chair (including a wheelchair)?: A Little Help needed standing up from a chair using your arms (e.g., wheelchair or bedside chair)?: A Little Help needed to walk in hospital room?: A Little Help needed climbing 3-5 steps with a railing? : A Lot 6 Click Score: 17    End of Session   Activity Tolerance: Patient limited by pain Patient left: in chair;with call bell/phone within reach;with chair alarm set;with nursing/sitter in room Nurse Communication: Mobility status PT Visit Diagnosis: Unsteadiness on feet (R26.81);Difficulty in walking, not elsewhere classified (R26.2);Pain Pain - Right/Left: Right Pain - part of body: (groin)    Time: 1125-1150 PT Time Calculation (min) (ACUTE ONLY): 25 min   Charges:   PT Evaluation $PT Eval Low Complexity: 1 Low          [place] Pager [phone] Office [phone]   Alfonso Carden 09/15/2019, 3:06 PM

## 2019-09-15 NOTE — Progress Notes (Signed)
Patient refused to be washed in his abd folds. He stated, " I want to go home".

## 2019-09-15 NOTE — Discharge Summary (Signed)
Physician Discharge Summary  Patient ID: Hayden Hayes MRN: [id number] DOB/AGE: 06-14-1966 53 y.o.  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  Patient was transferred from [facility] ER. Patient presented with 3-day history of abdominal pain.  CT scan revealed subcutaneous stranding and fatty infiltration along the right lateral abdominal wall and soft tissue thickening of fluid along fascial planes, rectal wall thickening, mesenteric stranding, mild perinephric stranding.   Patient has been seen by the general surgery and urology, Fournier's gangrene was ruled out. No surgical intervention recommended.  Patient was admitted and managed with IV vancomycin and Zosyn for likely abdominal panniculitis.  Abdominal panniculitis:  -Patient was managed with IV vancomycin and Zosyn during the hospital stay.   -Patient will be discharged back home on oral Augmentin.  -Panniculitis has resolved significantly.   -Surgical team assisted in directing patient's care.   Hypokalemia: -Monitor and and replaced during the hospital stay.   -Hypokalemia has resolved.  -Patient's primary care provider with kindly continue to monitor electrolytes.  Hypothyroidism: -continued Synthroid.  Hyperlipidemia: -continued Zocor  Gout:  -Stable.    Consults: general surgery and urology  Discharge Exam: Blood pressure (!) 149/78, pulse 80, temperature 98.2 F (36.8 C), temperature source Oral, resp. rate 20, height 5\' 4"  (1.626 m), weight (!) 181.4 kg, SpO2 97 %.  Disposition: Discharge disposition: 01-Home or Self Care   Discharge Instructions    Diet - low sodium  heart healthy   Complete by: As directed    Increase activity slowly   Complete by: As directed      Allergies as of 09/15/2019   No Known Allergies     Medication List    TAKE these medications   amoxicillin-clavulanate 875-125 MG tablet Commonly known as: Augmentin Take 1 tablet by mouth 2 (two) times daily for 7 days.   enalapril 2.5 MG tablet Commonly known as: VASOTEC TAKE 1 TABLET BY MOUTH EVERY DAY   levothyroxine 175 MCG tablet Commonly known as: SYNTHROID TAKE ONE TABLET (175 MCG DOSE) BY MOUTH DAILY. What changed: See the new instructions.   Mitigare 0.6 MG Caps Generic drug: Colchicine Take 0.6 mg by mouth 2 (two) times daily.   omeprazole 20 MG capsule Commonly known as: PRILOSEC Take 20 mg by mouth daily.   QUEtiapine 25 MG tablet Commonly known as: SEROQUEL Take 25 mg by mouth daily.   simvastatin 20 MG tablet Commonly known as: ZOCOR TAKE 2 TABLETS BY MOUTH EVERY DAY        Signed: Bonnell Public 09/15/2019, 1:10 PM

## 2019-10-22 NOTE — Progress Notes (Signed)
Cardiology Office Note   Date:  10/23/2019   ID:  Hayden Hayes, DOB May 04, 1966, MRN [id number]  PCP:  Sharion Balloon, FNP  Cardiologist:   No primary care provider on file. Referring:  Sharion Balloon, FNP  Chief Complaint  Patient presents with  . Palpitations      History of Present Illness: Hayden Hayes is a 53 y.o. male who is referred by Sharion Balloon, FNP for evaluation of palpitations.  The patient was in the hospital for abdominal wall panniculitis.  I reviewed these records for this visit.   There were no cardiac issues.  He reports that he had a large heart when he lived in [place] years ago.  He also had palpitations and wore a monitor.  He occasionally gets palpitations now.  He does not have any presyncope or syncope.  He denies any chest pressure, neck or arm discomfort.  He has some shortness of breath with no significant activities but he is very limited by his weight and his huge pannus.  He does not have any resting shortness of breath, PND or orthopnea.  He actually denies any chest pressure, neck or arm discomfort.  He has some left lower extremity greater than right lower extremity edema which seems to be stable.  He has lost about 30 pounds.  He is trying to be a little more active.   Past Medical History:  Diagnosis Date  . GERD (gastroesophageal reflux disease)   . Gout   . Hyperlipidemia   . Hypertension   . Thyroid disease     History reviewed. No pertinent surgical history.   Current Outpatient Medications  Medication Sig Dispense Refill  . enalapril (VASOTEC) 2.5 MG tablet TAKE 1 TABLET BY MOUTH EVERY DAY (Patient taking differently: Take 2.5 mg by mouth daily. ) 30 tablet 4  . levothyroxine (SYNTHROID) 175 MCG tablet TAKE ONE TABLET (175 MCG DOSE) BY MOUTH DAILY. (Patient taking differently: Take 175 mcg by mouth daily. ) 30 tablet 3  . MITIGARE 0.6 MG CAPS Take 0.6 mg by mouth 2 (two) times daily.    Marland Kitchen omeprazole (PRILOSEC) 20 MG  capsule Take 20 mg by mouth daily.    . QUEtiapine (SEROQUEL) 25 MG tablet Take 25 mg by mouth daily.    . simvastatin (ZOCOR) 20 MG tablet TAKE 2 TABLETS BY MOUTH EVERY DAY (Patient taking differently: Take 40 mg by mouth daily. ) 60 tablet 4   No current facility-administered medications for this visit.     Allergies:   Patient has no known allergies.    Social History:  The patient  reports that he has never smoked. He has never used smokeless tobacco. He reports that he does not drink alcohol or use drugs.   Family History:  The patient's family history includes Epilepsy in his mother; Heart disease (age of onset: 22) in his mother; Heart disease (age of onset: 50) in his father.    ROS:  Please see the history of present illness.   Otherwise, review of systems are positive for none.   All other systems are reviewed and negative.    PHYSICAL EXAM: VS:  BP 120/88   Pulse 100   Ht 5' (1.524 m)   Wt (!) 363 lb (164.7 kg)   BMI 70.89 kg/m  , BMI Body mass index is 70.89 kg/m. GEN:  No distress NECK:  No jugular venous distention at 90 degrees, waveform within normal limits, carotid upstroke brisk and  symmetric, no bruits, no thyromegaly LYMPHATICS:  No cervical adenopathy LUNGS:  Clear to auscultation bilaterally BACK:  No CVA tenderness CHEST:  Unremarkable HEART:  S1 and S2 within normal limits, no S3, no S4, no clicks, no rubs, no murmurs ABD:  Positive bowel sounds normal in frequency in pitch, no bruits, no rebound, no guarding, unable to assess midline mass or bruit with the patient seated.  Massive pannus EXT:  2 plus pulses throughout, mild to moderate left greater than right leg edema, no cyanosis no clubbing, left chronic venous stasis changes with a healed ulcer SKIN:  No rashes no nodules NEURO:  Cranial nerves II through XII grossly intact, motor grossly intact throughout PSYCH:  Cognitively intact, oriented to person place and time     EKG:  EKG is ordered  today. The ekg ordered today demonstrates sinus tachycardia, rate 100, left axis deviation, poor anterior R wave progression, borderline interventricular conduction delay.   Recent Labs: 07/11/2019: TSH 2.740 09/13/2019: ALT 17 09/14/2019: BUN 10; Hemoglobin 11.8; Magnesium 2.2; Platelets 223; Potassium 3.7; Sodium 139 09/15/2019: Creatinine, Ser 0.96    Lipid Panel    Component Value Date/Time   CHOL 175 07/11/2019 1013   TRIG 168 (H) 07/11/2019 1013   HDL 43 07/11/2019 1013   CHOLHDL 4.1 07/11/2019 1013   LDLCALC 98 07/11/2019 1013      Wt Readings from Last 3 Encounters:  10/23/19 (!) 363 lb (164.7 kg)  09/12/19 (!) 400 lb (181.4 kg)  07/11/19 (!) 396 lb (179.6 kg)      Other studies Reviewed: Additional studies/ records that were reviewed today include: Hospital records. Review of the above records demonstrates:  Please see elsewhere in the note.     ASSESSMENT AND PLAN:  PALPITATIONS:   The patient does not really have significant symptoms related to this.  No further testing or change in therapy is indicated specifically for this.  ABNORMAL EKG: Given the slightly abnormal EKG and his history of an enlarged heart I would like to order an echocardiogram.  OBESITY: I am happy to see that he is losing weight.  I talked to him about this.  Some days may be he be a candidate for pannus surgery.  COVID EDUCATION: We talked about the vaccine.  Current medicines are reviewed at length with the patient today.  The patient does not have concerns regarding medicines.  The following changes have been made:  no change  Labs/ tests ordered today include:   Orders Placed This Encounter  Procedures  . EKG 12-Lead  . ECHOCARDIOGRAM COMPLETE     Disposition:   FU with me as needed.      Signed, Minus Breeding, MD  10/23/2019 11:51 AM    [facility]

## 2019-10-23 ENCOUNTER — Other Ambulatory Visit: Payer: Self-pay

## 2019-10-23 ENCOUNTER — Encounter: Payer: Self-pay | Admitting: Cardiology

## 2019-10-23 ENCOUNTER — Telehealth: Payer: Self-pay | Admitting: Family

## 2019-10-23 ENCOUNTER — Ambulatory Visit: Payer: Medicaid Other | Admitting: Cardiology

## 2019-10-23 VITALS — BP 120/88 | HR 100 | Ht 60.0 in | Wt 363.0 lb

## 2019-10-23 DIAGNOSIS — R002 Palpitations: Secondary | ICD-10-CM | POA: Diagnosis not present

## 2019-10-23 DIAGNOSIS — E669 Obesity, unspecified: Secondary | ICD-10-CM | POA: Diagnosis not present

## 2019-10-23 DIAGNOSIS — Z7189 Other specified counseling: Secondary | ICD-10-CM

## 2019-10-23 DIAGNOSIS — R9431 Abnormal electrocardiogram [ECG] [EKG]: Secondary | ICD-10-CM | POA: Insufficient documentation

## 2019-10-23 NOTE — Telephone Encounter (Signed)
Last office visit in august.  We have not filled these.  Please advise.

## 2019-10-23 NOTE — Patient Instructions (Addendum)
Medication Instructions:  The current medical regimen is effective;  continue present plan and medications.  *If you need a refill on your cardiac medications before your next appointment, please call your pharmacy*  Testing/Procedures: Your physician has requested that you have an echocardiogram. Echocardiography is a painless test that uses sound waves to create images of your heart. It provides your doctor with information about the size and shape of your heart and how well your heart's chambers and valves are working. This procedure takes approximately one hour. There are no restrictions for this procedure.Check in at 12:45 pm at the [facility].  Follow-Up: Follow up as needed with Dr Percival Spanish after the above testing.  Thank you for choosing [facility]!!

## 2019-10-23 NOTE — Telephone Encounter (Signed)
Patient came in requesting that we send over refills for his Mitigare and Omeprazole to [facility] pharmacy in [place].

## 2019-10-24 MED ORDER — OMEPRAZOLE 20 MG PO CPDR: 20.0000 mg | DELAYED_RELEASE_CAPSULE | Freq: Every day | ORAL | 1 refills | Status: DC

## 2019-10-24 MED ORDER — MITIGARE 0.6 MG PO CAPS: 0.6000 mg | ORAL_CAPSULE | Freq: Two times a day (BID) | ORAL | 2 refills | Status: DC

## 2019-10-24 NOTE — Telephone Encounter (Signed)
Prescription sent to pharmacy.

## 2019-11-19 ENCOUNTER — Other Ambulatory Visit: Payer: Self-pay | Admitting: Family

## 2019-11-24 ENCOUNTER — Other Ambulatory Visit: Payer: Self-pay | Admitting: Family

## 2019-11-28 ENCOUNTER — Ambulatory Visit (HOSPITAL_COMMUNITY)
Admission: RE | Admit: 2019-11-28 | Discharge: 2019-11-28 | Disposition: A | Discharge status: Home / Self Care | Payer: Medicaid Other | Source: Ambulatory Visit | Attending: Family | Admitting: Family

## 2019-11-28 ENCOUNTER — Other Ambulatory Visit: Payer: Self-pay

## 2019-11-28 DIAGNOSIS — R002 Palpitations: Secondary | ICD-10-CM | POA: Insufficient documentation

## 2019-11-28 NOTE — Progress Notes (Signed)
*  PRELIMINARY RESULTS* Echocardiogram 2D Echocardiogram has been performed.  Leavy Cella 11/28/2019, 2:02 PM

## 2019-12-03 ENCOUNTER — Telehealth: Payer: Self-pay | Admitting: *Deleted

## 2019-12-03 NOTE — Telephone Encounter (Signed)
Left message to call back    Minus Breeding, MD  12/02/2019 7:11 AM EST    The EF is normal. No significant abnormalities. Call [name] with the results and send results to Sharion Balloon, FNP

## 2019-12-16 NOTE — Telephone Encounter (Signed)
Advised patient, verbalized understaning

## 2019-12-17 ENCOUNTER — Other Ambulatory Visit: Payer: Self-pay | Admitting: Family

## 2020-01-07 ENCOUNTER — Other Ambulatory Visit: Payer: Self-pay

## 2020-01-07 ENCOUNTER — Encounter: Payer: Self-pay | Admitting: Family

## 2020-01-07 ENCOUNTER — Ambulatory Visit: Payer: Medicaid Other | Admitting: Family

## 2020-01-07 VITALS — BP 140/87 | HR 99 | Temp 98.6°F | Ht 60.0 in | Wt 349.4 lb

## 2020-01-07 DIAGNOSIS — I10 Essential (primary) hypertension: Secondary | ICD-10-CM

## 2020-01-07 DIAGNOSIS — Z9989 Dependence on other enabling machines and devices: Secondary | ICD-10-CM

## 2020-01-07 DIAGNOSIS — R231 Pallor: Secondary | ICD-10-CM

## 2020-01-07 DIAGNOSIS — M793 Panniculitis, unspecified: Secondary | ICD-10-CM

## 2020-01-07 DIAGNOSIS — E039 Hypothyroidism, unspecified: Secondary | ICD-10-CM

## 2020-01-07 DIAGNOSIS — G4733 Obstructive sleep apnea (adult) (pediatric): Secondary | ICD-10-CM

## 2020-01-07 DIAGNOSIS — E785 Hyperlipidemia, unspecified: Secondary | ICD-10-CM

## 2020-01-07 DIAGNOSIS — M109 Gout, unspecified: Secondary | ICD-10-CM

## 2020-01-07 DIAGNOSIS — F411 Generalized anxiety disorder: Secondary | ICD-10-CM | POA: Diagnosis not present

## 2020-01-07 DIAGNOSIS — H109 Unspecified conjunctivitis: Secondary | ICD-10-CM

## 2020-01-07 DIAGNOSIS — F321 Major depressive disorder, single episode, moderate: Secondary | ICD-10-CM | POA: Diagnosis not present

## 2020-01-07 MED ORDER — ENALAPRIL MALEATE 2.5 MG PO TABS: 2.5000 mg | ORAL_TABLET | Freq: Every day | ORAL | 1 refills | Status: DC

## 2020-01-07 MED ORDER — MITIGARE 0.6 MG PO CAPS: 0.6000 mg | ORAL_CAPSULE | Freq: Two times a day (BID) | ORAL | 2 refills | Status: DC

## 2020-01-07 MED ORDER — ALLOPURINOL 100 MG PO TABS: 100.0000 mg | ORAL_TABLET | Freq: Every day | ORAL | 6 refills | Status: DC

## 2020-01-07 MED ORDER — POLYMYXIN B-TRIMETHOPRIM 10000-0.1 UNIT/ML-% OP SOLN: 1.0000 [drp] | Freq: Four times a day (QID) | OPHTHALMIC | 0 refills | Status: DC

## 2020-01-07 MED ORDER — SIMVASTATIN 20 MG PO TABS: 40.0000 mg | ORAL_TABLET | Freq: Every day | ORAL | 3 refills | Status: DC

## 2020-01-07 NOTE — Progress Notes (Signed)
Subjective:    Patient ID: Hayden Hayes, male    DOB: [birth year], 54 y.o.   MRN: [id number]  Chief Complaint  Patient presents with  . Medical Management of Chronic Issues    Patient was in the hospital for bad infection    PT presents to the office today for chronic follow up. He was  hospitalized to 09/12/19-09/15/19 for abdominal panniculitis. He was given IV Vancomycin and Zosyn and discharged on Augmentin. He states his redness and pain have resolved. He has not seen General Surgery since discharge.    He was seen by a Cardiologists on 10/23/19 and had a normal EF.   He has lost 47 lb since our last visit.  Thyroid Problem Presents for follow-up visit. Symptoms include anxiety and fatigue. Patient reports no depressed mood, diarrhea or dry skin. The symptoms have been stable. His past medical history is significant for hyperlipidemia.  Depression        This is a chronic problem.  The current episode started more than 1 year ago.   The onset quality is gradual.   The problem occurs intermittently.  Associated symptoms include fatigue, helplessness, restlessness, decreased interest and sad.  Associated symptoms include no hopelessness.  Past medical history includes thyroid problem and anxiety.   Anxiety Presents for follow-up visit. Symptoms include excessive worry, irritability, nervous/anxious behavior and restlessness. Patient reports no depressed mood. Symptoms occur occasionally. The severity of symptoms is moderate.    Hyperlipidemia This is a chronic problem. The current episode started more than 1 year ago. The problem is controlled. Recent lipid tests were reviewed and are normal. Exacerbating diseases include obesity. Current antihyperlipidemic treatment includes statins. The current treatment provides moderate improvement of lipids. Risk factors for coronary artery disease include dyslipidemia, male sex, hypertension and a sedentary lifestyle.  OSA Uses CPAP nightly.  Stable.  Gout Pt states he is taking Mitigare 0.6 mg BID when he has a gout attack. He reports he is having a gout attack when he stops. Discussed he should not be taking the Mitigare daily.    Review of Systems  Constitutional: Positive for fatigue and irritability.  Gastrointestinal: Negative for diarrhea.  Psychiatric/Behavioral: Positive for depression. The patient is nervous/anxious.        Objective:   Physical Exam Vitals reviewed.  Constitutional:      General: He is not in acute distress.    Appearance: He is well-developed. He is obese.  HENT:     Head: Normocephalic.     Right Ear: Tympanic membrane normal.     Left Ear: Tympanic membrane normal.  Eyes:     General:        Right eye: Discharge present.        Left eye: Discharge present.    Pupils: Pupils are equal, round, and reactive to light.  Neck:     Thyroid: No thyromegaly.  Cardiovascular:     Rate and Rhythm: Normal rate and regular rhythm.     Heart sounds: Normal heart sounds. No murmur.  Pulmonary:     Effort: Pulmonary effort is normal. No respiratory distress.     Breath sounds: Normal breath sounds. No wheezing.  Abdominal:     General: Bowel sounds are normal. There is no distension.     Palpations: Abdomen is soft.     Tenderness: There is no abdominal tenderness.  Musculoskeletal:        General: No tenderness. Normal range of motion.     Cervical  back: Normal range of motion and neck supple.  Skin:    General: Skin is warm and dry.     Coloration: Skin is pale.     Findings: No erythema or rash.     Comments: Large mass  Handing from anterior abdomin  Neurological:     Mental Status: He is alert and oriented to person, place, and time.     Cranial Nerves: No cranial nerve deficit.     Deep Tendon Reflexes: Reflexes are normal and symmetric.  Psychiatric:        Behavior: Behavior normal.        Thought Content: Thought content normal.        Judgment: Judgment normal.       BP  140/87   Pulse 99   Temp 98.6 F (37 C) (Temporal)   Ht 5' (1.524 m)   Wt (!) 349 lb 6.4 oz (158.5 kg)   SpO2 97%   BMI 68.24 kg/m      Assessment & Plan:  Hayden Hayes comes in today with chief complaint of Medical Management of Chronic Issues (Patient was in the hospital for bad infection )   Diagnosis and orders addressed:  1. Hypothyroidism, unspecified type - CMP14+EGFR - TSH  2. Panniculitis PT has lost 47 lbs since our last visit, pt's quality of life would greatly increase if he could have mass removed as this is causing difficulties with walking and sitting. - Ambulatory referral to General Surgery - CMP14+EGFR  3. Depression, major, single episode, moderate (HCC) - CMP14+EGFR  4. GAD (generalized anxiety disorder) - CMP14+EGFR  5. Acute gout of multiple sites, unspecified cause Will start allopurinol 100 mg today Discussed the importance of only taking Mitigate as needed and not on a daily basis.  - MITIGARE 0.6 MG CAPS; Take 0.6 mg by mouth 2 (two) times daily.  Dispense: 60 capsule; Refill: 2 - allopurinol (ZYLOPRIM) 100 MG tablet; Take 1 tablet (100 mg total) by mouth daily.  Dispense: 30 tablet; Refill: 6 - CMP14+EGFR - Uric acid  6. Hyperlipidemia, unspecified hyperlipidemia type - simvastatin (ZOCOR) 20 MG tablet; Take 2 tablets (40 mg total) by mouth daily.  Dispense: 180 tablet; Refill: 3 - CMP14+EGFR  7. OSA on CPAP - CMP14+EGFR  8. Essential hypertension with goal blood pressure less than 130/80 - enalapril (VASOTEC) 2.5 MG tablet; Take 1 tablet (2.5 mg total) by mouth daily.  Dispense: 90 tablet; Refill: 1 - CMP14+EGFR  9. Pale - CMP14+EGFR - Anemia Profile B  10. Bacterial conjunctivitis of both eyes Keep clean and dry  Do not rub eye  - trimethoprim-polymyxin b (POLYTRIM) ophthalmic solution; Place 1 drop into both eyes every 6 (six) hours.  Dispense: 10 mL; Refill: 0   Labs pending Health Maintenance reviewed Diet and exercise  encouraged  Follow up plan: 1 month to recheck gout   Evelina Dun, FNP

## 2020-01-07 NOTE — Patient Instructions (Signed)
Gout  Gout is a condition that causes painful swelling of the joints. Gout is a type of inflammation of the joints (arthritis). This condition is caused by having too much uric acid in the body. Uric acid is a chemical that forms when the body breaks down substances called purines. Purines are important for building body proteins. When the body has too much uric acid, sharp crystals can form and build up inside the joints. This causes pain and swelling. Gout attacks can happen quickly and may be very painful (acute gout). Over time, the attacks can affect more joints and become more frequent (chronic gout). Gout can also cause uric acid to build up under the skin and inside the kidneys. What are the causes? This condition is caused by too much uric acid in your blood. This can happen because:  Your kidneys do not remove enough uric acid from your blood. This is the most common cause.  Your body makes too much uric acid. This can happen with some cancers and cancer treatments. It can also occur if your body is breaking down too many red blood cells (hemolytic anemia).  You eat too many foods that are high in purines. These foods include organ meats and some seafood. Alcohol, especially beer, is also high in purines. A gout attack may be triggered by trauma or stress. What increases the risk? You are more likely to develop this condition if you:  Have a family history of gout.  Are male and middle-aged.  Are male and have gone through menopause.  Are obese.  Frequently drink alcohol, especially beer.  Are dehydrated.  Lose weight too quickly.  Have an organ transplant.  Have lead poisoning.  Take certain medicines, including aspirin, cyclosporine, diuretics, levodopa, and niacin.  Have kidney disease.  Have a skin condition called psoriasis. What are the signs or symptoms? An attack of acute gout happens quickly. It usually occurs in just one joint. The most common place is  the big toe. Attacks often start at night. Other joints that may be affected include joints of the feet, ankle, knee, fingers, wrist, or elbow. Symptoms of this condition may include:  Severe pain.  Warmth.  Swelling.  Stiffness.  Tenderness. The affected joint may be very painful to touch.  Shiny, red, or purple skin.  Chills and fever. Chronic gout may cause symptoms more frequently. More joints may be involved. You may also have white or yellow lumps (tophi) on your hands or feet or in other areas near your joints. How is this diagnosed? This condition is diagnosed based on your symptoms, medical history, and physical exam. You may have tests, such as:  Blood tests to measure uric acid levels.  Removal of joint fluid with a thin needle (aspiration) to look for uric acid crystals.  X-rays to look for joint damage. How is this treated? Treatment for this condition has two phases: treating an acute attack and preventing future attacks. Acute gout treatment may include medicines to reduce pain and swelling, including:  NSAIDs.  Steroids. These are strong anti-inflammatory medicines that can be taken by mouth (orally) or injected into a joint.  Colchicine. This medicine relieves pain and swelling when it is taken soon after an attack. It can be given by mouth or through an IV. Preventive treatment may include:  Daily use of smaller doses of NSAIDs or colchicine.  Use of a medicine that reduces uric acid levels in your blood.  Changes to your diet. You may   need to see a dietitian about what to eat and drink to prevent gout. Follow these instructions at home: During a gout attack   If directed, put ice on the affected area: ? Put ice in a plastic bag. ? Place a towel between your skin and the bag. ? Leave the ice on for 20 minutes, 2-3 times a day.  Raise (elevate) the affected joint above the level of your heart as often as possible.  Rest the joint as much as possible.  If the affected joint is in your leg, you may be given crutches to use.  Follow instructions from your health care provider about eating or drinking restrictions. Avoiding future gout attacks  Follow a low-purine diet as told by your dietitian or health care provider. Avoid foods and drinks that are high in purines, including liver, kidney, anchovies, asparagus, herring, mushrooms, mussels, and beer.  Maintain a healthy weight or lose weight if you are overweight. If you want to lose weight, talk with your health care provider. It is important that you do not lose weight too quickly.  Start or maintain an exercise program as told by your health care provider. Eating and drinking  Drink enough fluids to keep your urine pale yellow.  If you drink alcohol: ? Limit how much you use to:  0-1 drink a day for women.  0-2 drinks a day for men. ? Be aware of how much alcohol is in your drink. In the [place] one drink equals one 12 oz bottle of beer (355 mL) one 5 oz glass of wine (148 mL), or one 1 oz glass of hard liquor (44 mL). General instructions  Take over-the-counter and prescription medicines only as told by your health care provider.  Do not drive or use heavy machinery while taking prescription pain medicine.  Return to your normal activities as told by your health care provider. Ask your health care provider what activities are safe for you.  Keep all follow-up visits as told by your health care provider. This is important. Contact a health care provider if you have:  Another gout attack.  Continuing symptoms of a gout attack after 10 days of treatment.  Side effects from your medicines.  Chills or a fever.  Burning pain when you urinate.  Pain in your lower back or belly. Get help right away if you:  Have severe or uncontrolled pain.  Cannot urinate. Summary  Gout is painful swelling of the joints caused by inflammation.  The most common site of pain is the big  toe, but it can affect other joints in the body.  Medicines and dietary changes can help to prevent and treat gout attacks. This information is not intended to replace advice given to you by your health care provider. Make sure you discuss any questions you have with your health care provider. Document Revised: 05/23/2018 Document Reviewed: 05/23/2018 Elsevier Patient Education  2020 [employer]  

## 2020-01-08 LAB — ANEMIA PROFILE B
Basophils Absolute: 0 10*3/uL (ref 0.0–0.2)
Basos: 0 %
EOS (ABSOLUTE): 0.2 10*3/uL (ref 0.0–0.4)
Eos: 4 %
Ferritin: 248 ng/mL (ref 30–400)
Folate: 6.5 ng/mL (ref >3.0)
Hematocrit: 40.7 % (ref 37.5–51.0)
Hemoglobin: 13.5 g/dL (ref 13.0–17.7)
Immature Grans (Abs): 0 10*3/uL (ref 0.0–0.1)
Immature Granulocytes: 0 %
Iron Saturation: 21 % (ref 15–55)
Iron: 68 ug/dL (ref 38–169)
Lymphocytes Absolute: 1.2 10*3/uL (ref 0.7–3.1)
Lymphs: 26 %
MCH: 31 pg (ref 26.6–33.0)
MCHC: 33.2 g/dL (ref 31.5–35.7)
MCV: 93 fL (ref 79–97)
Monocytes Absolute: 0.4 10*3/uL (ref 0.1–0.9)
Monocytes: 9 %
Neutrophils Absolute: 2.8 10*3/uL (ref 1.4–7.0)
Neutrophils: 61 %
Platelets: 255 10*3/uL (ref 150–450)
RBC: 4.36 x10E6/uL (ref 4.14–5.80)
RDW: 13.4 % (ref 11.6–15.4)
Retic Ct Pct: 1.7 % (ref 0.6–2.6)
Total Iron Binding Capacity: 319 ug/dL (ref 250–450)
UIBC: 251 ug/dL (ref 111–343)
Vitamin B-12: 421 pg/mL (ref 232–1245)
WBC: 4.6 10*3/uL (ref 3.4–10.8)

## 2020-01-08 LAB — CMP14+EGFR
ALT: 19 IU/L (ref 0–44)
AST: 21 IU/L (ref 0–40)
Albumin/Globulin Ratio: 1.4 (ref 1.2–2.2)
Albumin: 4.7 g/dL (ref 3.8–4.9)
Alkaline Phosphatase: 68 IU/L (ref 39–117)
BUN/Creatinine Ratio: 22 — ABNORMAL HIGH (ref 9–20)
BUN: 20 mg/dL (ref 6–24)
Bilirubin Total: 0.5 mg/dL (ref 0.0–1.2)
CO2: 20 mmol/L (ref 20–29)
Calcium: 9.8 mg/dL (ref 8.7–10.2)
Chloride: 104 mmol/L (ref 96–106)
Creatinine, Ser: 0.93 mg/dL (ref 0.76–1.27)
GFR calc Af Amer: 107 mL/min/{1.73_m2} (ref >59)
GFR calc non Af Amer: 93 mL/min/{1.73_m2} (ref >59)
Globulin, Total: 3.3 g/dL (ref 1.5–4.5)
Glucose: 99 mg/dL (ref 65–99)
Potassium: 4.2 mmol/L (ref 3.5–5.2)
Sodium: 140 mmol/L (ref 134–144)
Total Protein: 8 g/dL (ref 6.0–8.5)

## 2020-01-08 LAB — TSH: TSH: 2.14 u[IU]/mL (ref 0.450–4.500)

## 2020-01-08 LAB — URIC ACID: Uric Acid: 9.2 mg/dL — ABNORMAL HIGH (ref 3.8–8.4)

## 2020-01-30 ENCOUNTER — Other Ambulatory Visit: Payer: Self-pay | Admitting: Family

## 2020-02-04 ENCOUNTER — Encounter: Payer: Self-pay | Admitting: Family

## 2020-02-04 ENCOUNTER — Other Ambulatory Visit: Payer: Self-pay

## 2020-02-04 ENCOUNTER — Ambulatory Visit: Payer: Medicaid Other | Admitting: Family

## 2020-02-04 VITALS — BP 134/83 | HR 79 | Temp 97.1°F | Ht 60.0 in | Wt 351.0 lb

## 2020-02-04 DIAGNOSIS — M1A09X0 Idiopathic chronic gout, multiple sites, without tophus (tophi): Secondary | ICD-10-CM

## 2020-02-04 MED ORDER — ALLOPURINOL 300 MG PO TABS: 300.0000 mg | ORAL_TABLET | Freq: Every day | ORAL | 6 refills | Status: DC

## 2020-02-04 NOTE — Patient Instructions (Signed)
 Gout  Gout is painful swelling of your joints. Gout is a type of arthritis. It is caused by having too much uric acid in your body. Uric acid is a chemical that is made when your body breaks down substances called purines. If your body has too much uric acid, sharp crystals can form and build up in your joints. This causes pain and swelling. Gout attacks can happen quickly and be very painful (acute gout). Over time, the attacks can affect more joints and happen more often (chronic gout). What are the causes?  Too much uric acid in your blood. This can happen because: ? Your kidneys do not remove enough uric acid from your blood. ? Your body makes too much uric acid. ? You eat too many foods that are high in purines. These foods include organ meats, some seafood, and beer.  Trauma or stress. What increases the risk?  Having a family history of gout.  Being male and middle-aged.  Being male and having gone through menopause.  Being very overweight (obese).  Drinking alcohol, especially beer.  Not having enough water in the body (being dehydrated).  Losing weight too quickly.  Having an organ transplant.  Having lead poisoning.  Taking certain medicines.  Having kidney disease.  Having a skin condition called psoriasis. What are the signs or symptoms? An attack of acute gout usually happens in just one joint. The most common place is the big toe. Attacks often start at night. Other joints that may be affected include joints of the feet, ankle, knee, fingers, wrist, or elbow. Symptoms of an attack may include:  Very bad pain.  Warmth.  Swelling.  Stiffness.  Shiny, red, or purple skin.  Tenderness. The affected joint may be very painful to touch.  Chills and fever. Chronic gout may cause symptoms more often. More joints may be involved. You may also have white or yellow lumps (tophi) on your hands or feet or in other areas near your joints. How is this  treated?  Treatment for this condition has two phases: treating an acute attack and preventing future attacks.  Acute gout treatment may include: ? NSAIDs. ? Steroids. These are taken by mouth or injected into a joint. ? Colchicine. This medicine relieves pain and swelling. It can be given by mouth or through an IV tube.  Preventive treatment may include: ? Taking small doses of NSAIDs or colchicine daily. ? Using a medicine that reduces uric acid levels in your blood. ? Making changes to your diet. You may need to see a food expert (dietitian) about what to eat and drink to prevent gout. Follow these instructions at home: During a gout attack   If told, put ice on the painful area: ? Put ice in a plastic bag. ? Place a towel between your skin and the bag. ? Leave the ice on for 20 minutes, 2-3 times a day.  Raise (elevate) the painful joint above the level of your heart as often as you can.  Rest the joint as much as possible. If the joint is in your leg, you may be given crutches.  Follow instructions from your doctor about what you cannot eat or drink. Avoiding future gout attacks  Eat a low-purine diet. Avoid foods and drinks such as: ? Liver. ? Kidney. ? Anchovies. ? Asparagus. ? Herring. ? Mushrooms. ? Mussels. ? Beer.  Stay at a healthy weight. If you want to lose weight, talk with your doctor. Do not lose   weight too fast.  Start or continue an exercise plan as told by your doctor. Eating and drinking  Drink enough fluids to keep your pee (urine) pale yellow.  If you drink alcohol: ? Limit how much you use to:  0-1 drink a day for women.  0-2 drinks a day for men. ? Be aware of how much alcohol is in your drink. In the [place] one drink equals one 12 oz bottle of beer (355 mL), one 5 oz glass of wine (148 mL), or one 1 oz glass of hard liquor (44 mL). General instructions  Take over-the-counter and prescription medicines only as told by your doctor.  Do  not drive or use heavy machinery while taking prescription pain medicine.  Return to your normal activities as told by your doctor. Ask your doctor what activities are safe for you.  Keep all follow-up visits as told by your doctor. This is important. Contact a doctor if:  You have another gout attack.  You still have symptoms of a gout attack after 10 days of treatment.  You have problems (side effects) because of your medicines.  You have chills or a fever.  You have burning pain when you pee (urinate).  You have pain in your lower back or belly. Get help right away if:  You have very bad pain.  Your pain cannot be controlled.  You cannot pee. Summary  Gout is painful swelling of the joints.  The most common site of pain is the big toe, but it can affect other joints.  Medicines and avoiding some foods can help to prevent and treat gout attacks. This information is not intended to replace advice given to you by your health care provider. Make sure you discuss any questions you have with your health care provider. Document Revised: 05/23/2018 Document Reviewed: 05/23/2018 Elsevier Patient Education  2020 [employer] Inc[employer]  

## 2020-02-04 NOTE — Progress Notes (Signed)
   Subjective:    Patient ID: Hayden Hayes, male    DOB: Jul 10, 1966, 54 y.o.   MRN: [id number]  Chief Complaint  Patient presents with  . Gout    1 mth follow up    HPI PT presents to the office today for follow up on gout. He had been taking colchicine daily and we stopped this and started allopurinol 100 mg. He states he has a gout flare up in right foot that started one week after starting allopurinol. He states he took his colchicine and it went away. No other flare ups.    Review of Systems  All other systems reviewed and are negative.      Objective:   Physical Exam Vitals reviewed.  Constitutional:      General: He is not in acute distress.    Appearance: He is well-developed.  HENT:     Head: Normocephalic.     Right Ear: Tympanic membrane normal.     Left Ear: Tympanic membrane normal.  Eyes:     General:        Right eye: No discharge.        Left eye: No discharge.     Pupils: Pupils are equal, round, and reactive to light.  Neck:     Thyroid: No thyromegaly.  Cardiovascular:     Rate and Rhythm: Normal rate and regular rhythm.     Heart sounds: Normal heart sounds. No murmur.  Pulmonary:     Effort: Pulmonary effort is normal. No respiratory distress.     Breath sounds: Normal breath sounds. No wheezing.  Abdominal:     General: Bowel sounds are normal. There is no distension.     Palpations: Abdomen is soft.     Tenderness: There is no abdominal tenderness.     Comments: Large panniculitis that is drops between groin  Musculoskeletal:        General: No tenderness. Normal range of motion.     Cervical back: Normal range of motion and neck supple.  Skin:    General: Skin is warm and dry.     Coloration: Skin is pale.     Findings: No erythema or rash.  Neurological:     Mental Status: He is alert and oriented to person, place, and time.     Cranial Nerves: No cranial nerve deficit.     Deep Tendon Reflexes: Reflexes are normal and symmetric.    Psychiatric:        Behavior: Behavior normal.        Thought Content: Thought content normal.        Judgment: Judgment normal.     BP 134/83   Pulse 79   Temp (!) 97.1 F (36.2 C) (Temporal)   Ht 5' (1.524 m)   Wt (!) 351 lb (159.2 kg)   SpO2 97%   BMI 68.55 kg/m        Assessment & Plan:  1. Chronic gout of multiple sites, unspecified cause Will increase allopurinol to 300 mg from 100 mg  Low purine diet Force fluids - allopurinol (ZYLOPRIM) 300 MG tablet; Take 1 tablet (300 mg total) by mouth daily.  Dispense: 30 tablet; Refill: 6   Number for [facility] given today to make appt to follow up on Panniculitis   Evelina Dun, [place], FNP

## 2020-02-27 ENCOUNTER — Ambulatory Visit (INDEPENDENT_AMBULATORY_CARE_PROVIDER_SITE_OTHER): Payer: Medicaid Other | Admitting: Plastic Surgery

## 2020-02-27 ENCOUNTER — Other Ambulatory Visit: Payer: Self-pay

## 2020-02-27 ENCOUNTER — Encounter: Payer: Self-pay | Admitting: Plastic Surgery

## 2020-02-27 VITALS — BP 145/87 | HR 87 | Temp 97.5°F | Ht 60.0 in | Wt 344.8 lb

## 2020-02-27 DIAGNOSIS — M793 Panniculitis, unspecified: Secondary | ICD-10-CM | POA: Diagnosis not present

## 2020-02-27 NOTE — Progress Notes (Signed)
Referring Provider Sharion Balloon, [address] [place],  [place] [address]   CC:  Chief Complaint  Patient presents with  . Advice Only    for panniculectomy      Hayden Hayes is an 54 y.o. male.  HPI: Patient presents to discuss panniculectomy.  He has a chronic overhanging pannus that has been treated for infections before.  He has rashes at the bottom of the pannus and in the crease.  He has pain with movement and while sitting down.  He is currently about 50 pounds lighter than his heaviest weight but his weight has been relatively stable for the past few months.  He was sent by his primary care doctor to discuss removing the overhanging skin.  No Known Allergies  Outpatient Encounter Medications as of 02/27/2020  Medication Sig  . allopurinol (ZYLOPRIM) 300 MG tablet Take 1 tablet (300 mg total) by mouth daily.  . enalapril (VASOTEC) 2.5 MG tablet Take 1 tablet (2.5 mg total) by mouth daily.  Marland Kitchen levothyroxine (SYNTHROID) 175 MCG tablet TAKE ONE TABLET (175 MCG DOSE) BY MOUTH DAILY.  . Misc. Devices MISC New cpap supplies. DME [facility].  . omeprazole (PRILOSEC) 20 MG capsule TAKE 1 CAPSULE BY MOUTH EVERY DAY  . QUEtiapine (SEROQUEL) 25 MG tablet TAKE 1 TABLET BY MOUTH EVERY DAY  . simvastatin (ZOCOR) 20 MG tablet Take 2 tablets (40 mg total) by mouth daily.   No facility-administered encounter medications on file as of 02/27/2020.     Past Medical History:  Diagnosis Date  . GERD (gastroesophageal reflux disease)   . Gout   . Hyperlipidemia   . Hypertension   . Thyroid disease     No past surgical history on file.  Family History  Problem Relation Age of Onset  . Heart disease Mother 16       CHF, hemachromatosis  . Epilepsy Mother   . Heart disease Father 60       Heart attacks and stents in neck    Social History   Social History Narrative   Lives alone.       Review of Systems General: Denies fevers, chills, weight loss CV: Denies chest  pain, palpitations  Physical Exam Vitals with BMI 02/27/2020 02/04/2020 01/07/2020  Height 5\' 0"  5\' 0"  -  Weight 344 lbs 13 oz 351 lbs -  BMI A999333 0000000 -  Systolic Q000111Q Q000111Q [id number]  Diastolic 87 83 87  Pulse 87 79 -    General:  No acute distress,  Alert and oriented, Non-Toxic, Normal speech and affect Abdomen: He has a large overhanging pannus.  There is lymphedematous changes at the bottom.  In a seated position it hangs almost to the ground.  I do not see any obvious scars.  There is signs of skin irritation in the crease beneath it.  Assessment/Plan Patient presents to discuss panniculectomy.  He clearly has a symptomatic abdominal pannus.  A BMI of 68 puts him at an extremely high risk category for surgery.  I recommended he [facility] care at a [facility] setting given his complicated problem.  He should probably have a cardiology and pulmonary work-up prior to undergoing surgery.  Additionally he may be a candidate for bariatric surgery prior to panniculectomy that might decrease his risk profile.  Patient and his brother are both in agreement with this plan.  We will provide information for them and help them seek care at a tertiary facility.  Cindra Presume 02/27/2020, 12:35  PM

## 2020-05-22 ENCOUNTER — Other Ambulatory Visit: Payer: Self-pay

## 2020-05-22 ENCOUNTER — Encounter: Payer: Self-pay | Admitting: Family

## 2020-05-22 ENCOUNTER — Ambulatory Visit: Payer: Medicaid Other | Admitting: Family

## 2020-05-22 VITALS — BP 118/68 | HR 86 | Temp 97.8°F | Ht 60.0 in | Wt 343.4 lb

## 2020-05-22 DIAGNOSIS — F411 Generalized anxiety disorder: Secondary | ICD-10-CM

## 2020-05-22 DIAGNOSIS — I10 Essential (primary) hypertension: Secondary | ICD-10-CM | POA: Diagnosis not present

## 2020-05-22 DIAGNOSIS — M109 Gout, unspecified: Secondary | ICD-10-CM

## 2020-05-22 DIAGNOSIS — E785 Hyperlipidemia, unspecified: Secondary | ICD-10-CM | POA: Diagnosis not present

## 2020-05-22 DIAGNOSIS — F321 Major depressive disorder, single episode, moderate: Secondary | ICD-10-CM

## 2020-05-22 DIAGNOSIS — M793 Panniculitis, unspecified: Secondary | ICD-10-CM

## 2020-05-22 DIAGNOSIS — Z9989 Dependence on other enabling machines and devices: Secondary | ICD-10-CM

## 2020-05-22 DIAGNOSIS — G4733 Obstructive sleep apnea (adult) (pediatric): Secondary | ICD-10-CM

## 2020-05-22 DIAGNOSIS — E039 Hypothyroidism, unspecified: Secondary | ICD-10-CM

## 2020-05-22 NOTE — Patient Instructions (Signed)
Gout  Gout is a condition that causes painful swelling of the joints. Gout is a type of inflammation of the joints (arthritis). This condition is caused by having too much uric acid in the body. Uric acid is a chemical that forms when the body breaks down substances called purines. Purines are important for building body proteins. When the body has too much uric acid, sharp crystals can form and build up inside the joints. This causes pain and swelling. Gout attacks can happen quickly and may be very painful (acute gout). Over time, the attacks can affect more joints and become more frequent (chronic gout). Gout can also cause uric acid to build up under the skin and inside the kidneys. What are the causes? This condition is caused by too much uric acid in your blood. This can happen because:  Your kidneys do not remove enough uric acid from your blood. This is the most common cause.  Your body makes too much uric acid. This can happen with some cancers and cancer treatments. It can also occur if your body is breaking down too many red blood cells (hemolytic anemia).  You eat too many foods that are high in purines. These foods include organ meats and some seafood. Alcohol, especially beer, is also high in purines. A gout attack may be triggered by trauma or stress. What increases the risk? You are more likely to develop this condition if you:  Have a family history of gout.  Are male and middle-aged.  Are male and have gone through menopause.  Are obese.  Frequently drink alcohol, especially beer.  Are dehydrated.  Lose weight too quickly.  Have an organ transplant.  Have lead poisoning.  Take certain medicines, including aspirin, cyclosporine, diuretics, levodopa, and niacin.  Have kidney disease.  Have a skin condition called psoriasis. What are the signs or symptoms? An attack of acute gout happens quickly. It usually occurs in just one joint. The most common place is  the big toe. Attacks often start at night. Other joints that may be affected include joints of the feet, ankle, knee, fingers, wrist, or elbow. Symptoms of this condition may include:  Severe pain.  Warmth.  Swelling.  Stiffness.  Tenderness. The affected joint may be very painful to touch.  Shiny, red, or purple skin.  Chills and fever. Chronic gout may cause symptoms more frequently. More joints may be involved. You may also have white or yellow lumps (tophi) on your hands or feet or in other areas near your joints. How is this diagnosed? This condition is diagnosed based on your symptoms, medical history, and physical exam. You may have tests, such as:  Blood tests to measure uric acid levels.  Removal of joint fluid with a thin needle (aspiration) to look for uric acid crystals.  X-rays to look for joint damage. How is this treated? Treatment for this condition has two phases: treating an acute attack and preventing future attacks. Acute gout treatment may include medicines to reduce pain and swelling, including:  NSAIDs.  Steroids. These are strong anti-inflammatory medicines that can be taken by mouth (orally) or injected into a joint.  Colchicine. This medicine relieves pain and swelling when it is taken soon after an attack. It can be given by mouth or through an IV. Preventive treatment may include:  Daily use of smaller doses of NSAIDs or colchicine.  Use of a medicine that reduces uric acid levels in your blood.  Changes to your diet. You may   need to see a dietitian about what to eat and drink to prevent gout. Follow these instructions at home: During a gout attack   If directed, put ice on the affected area: ? Put ice in a plastic bag. ? Place a towel between your skin and the bag. ? Leave the ice on for 20 minutes, 2-3 times a day.  Raise (elevate) the affected joint above the level of your heart as often as possible.  Rest the joint as much as possible.  If the affected joint is in your leg, you may be given crutches to use.  Follow instructions from your health care provider about eating or drinking restrictions. Avoiding future gout attacks  Follow a low-purine diet as told by your dietitian or health care provider. Avoid foods and drinks that are high in purines, including liver, kidney, anchovies, asparagus, herring, mushrooms, mussels, and beer.  Maintain a healthy weight or lose weight if you are overweight. If you want to lose weight, talk with your health care provider. It is important that you do not lose weight too quickly.  Start or maintain an exercise program as told by your health care provider. Eating and drinking  Drink enough fluids to keep your urine pale yellow.  If you drink alcohol: ? Limit how much you use to:  0-1 drink a day for women.  0-2 drinks a day for men. ? Be aware of how much alcohol is in your drink. In the [place] one drink equals one 12 oz bottle of beer (355 mL) one 5 oz glass of wine (148 mL), or one 1 oz glass of hard liquor (44 mL). General instructions  Take over-the-counter and prescription medicines only as told by your health care provider.  Do not drive or use heavy machinery while taking prescription pain medicine.  Return to your normal activities as told by your health care provider. Ask your health care provider what activities are safe for you.  Keep all follow-up visits as told by your health care provider. This is important. Contact a health care provider if you have:  Another gout attack.  Continuing symptoms of a gout attack after 10 days of treatment.  Side effects from your medicines.  Chills or a fever.  Burning pain when you urinate.  Pain in your lower back or belly. Get help right away if you:  Have severe or uncontrolled pain.  Cannot urinate. Summary  Gout is painful swelling of the joints caused by inflammation.  The most common site of pain is the big  toe, but it can affect other joints in the body.  Medicines and dietary changes can help to prevent and treat gout attacks. This information is not intended to replace advice given to you by your health care provider. Make sure you discuss any questions you have with your health care provider. Document Revised: 05/23/2018 Document Reviewed: 05/23/2018 Elsevier Patient Education  2020 [employer]  

## 2020-05-22 NOTE — Progress Notes (Signed)
Subjective:    Patient ID: Hayden Hayes, male    DOB: [birth year], 54 y.o.   MRN: [id number]  Chief Complaint  Patient presents with  . Medical Management of Chronic Issues   PT presents to the office today for chronic follow up. Pt has seen General Surgery of large panniculitis, but was told he was not a candidate for surgery.   He has lost 53 lb over the last year.  Hypertension This is a chronic problem. The current episode started more than 1 year ago. The problem has been resolved since onset. The problem is controlled. Associated symptoms include anxiety. Pertinent negatives include no malaise/fatigue, peripheral edema or shortness of breath. Risk factors for coronary artery disease include obesity, male gender, sedentary lifestyle and dyslipidemia. The current treatment provides moderate improvement. There is no history of kidney disease, CVA or heart failure. Identifiable causes of hypertension include a thyroid problem.  Thyroid Problem Presents for follow-up visit. Symptoms include depressed mood. Patient reports no anxiety, constipation, dry skin, hair loss or hoarse voice. The symptoms have been stable. His past medical history is significant for hyperlipidemia. There is no history of heart failure.  Depression        This is a chronic problem.  The current episode started more than 1 year ago.   The onset quality is gradual.   The problem occurs intermittently.  Associated symptoms include no helplessness, no hopelessness, not irritable and no restlessness.  Compliance with treatment is good.  Past medical history includes hypothyroidism, thyroid problem and anxiety.   Anxiety Presents for follow-up visit. Symptoms include depressed mood, excessive worry, irritability and panic. Patient reports no nervous/anxious behavior, restlessness or shortness of breath. Symptoms occur most days. The quality of sleep is good.    Hyperlipidemia This is a chronic problem. The current episode  started more than 1 year ago. The problem is controlled. Recent lipid tests were reviewed and are normal. Exacerbating diseases include hypothyroidism. Pertinent negatives include no shortness of breath. Current antihyperlipidemic treatment includes statins. The current treatment provides moderate improvement of lipids. Risk factors for coronary artery disease include dyslipidemia, male sex, hypertension and a sedentary lifestyle.  OSA Uses CPAP nightly, stable.  Gout Pt taking Allopurinol 300 mg daily. States he had a mild flare up two weeks ago, but was not very painful.    Review of Systems  Constitutional: Positive for irritability. Negative for malaise/fatigue.  HENT: Negative for hoarse voice.   Respiratory: Negative for shortness of breath.   Gastrointestinal: Negative for constipation.  Psychiatric/Behavioral: Positive for depression. The patient is not nervous/anxious.   All other systems reviewed and are negative.      Objective:   Physical Exam Vitals reviewed.  Constitutional:      General: He is not irritable.He is not in acute distress.    Appearance: He is well-developed. He is obese.  HENT:     Head: Normocephalic.     Right Ear: Tympanic membrane normal.     Left Ear: Tympanic membrane normal.  Eyes:     General:        Right eye: No discharge.        Left eye: No discharge.     Pupils: Pupils are equal, round, and reactive to light.  Neck:     Thyroid: No thyromegaly.  Cardiovascular:     Rate and Rhythm: Normal rate and regular rhythm.     Heart sounds: Normal heart sounds. No murmur heard.   Pulmonary:  Effort: Pulmonary effort is normal. No respiratory distress.     Breath sounds: Normal breath sounds. No wheezing.  Abdominal:     General: Bowel sounds are normal. There is no distension.     Palpations: Abdomen is soft.     Tenderness: There is no abdominal tenderness.     Comments: Extremely large pannus overhanging abdomen   Musculoskeletal:          General: No tenderness. Normal range of motion.     Cervical back: Normal range of motion and neck supple.  Skin:    General: Skin is warm and dry.     Coloration: Skin is pale.     Findings: No erythema or rash.  Neurological:     Mental Status: He is alert and oriented to person, place, and time.     Cranial Nerves: No cranial nerve deficit.     Deep Tendon Reflexes: Reflexes are normal and symmetric.  Psychiatric:        Behavior: Behavior normal.        Thought Content: Thought content normal.        Judgment: Judgment normal.       BP 118/68   Pulse 86   Temp 97.8 F (36.6 C) (Temporal)   Ht 5' (1.524 m)   Wt (!) 343 lb 6.4 oz (155.8 kg)   SpO2 94%   BMI 67.07 kg/m      Assessment & Plan:  Hayden Hayes comes in today with chief complaint of Medical Management of Chronic Issues   Diagnosis and orders addressed:  1. Essential hypertension with goal blood pressure less than 130/80 - CMP14+EGFR - CBC with Differential/Platelet  2. Hyperlipidemia, unspecified hyperlipidemia type - CMP14+EGFR - CBC with Differential/Platelet  3. Acute gout of multiple sites, unspecified cause - CMP14+EGFR - CBC with Differential/Platelet - Uric acid  4. GAD (generalized anxiety disorder) - CMP14+EGFR - CBC with Differential/Platelet  5. Depression, major, single episode, moderate (HCC) - CMP14+EGFR - CBC with Differential/Platelet  6. Panniculitis Encouraged to continue to weight loss, does not wish to have referral to another surgeon  - CMP14+EGFR - CBC with Differential/Platelet  7. Hypothyroidism, unspecified type - CMP14+EGFR - CBC with Differential/Platelet  8. OSA on CPAP - CMP14+EGFR - CBC with Differential/Platelet   Labs pending Health Maintenance reviewed Diet and exercise encouraged  Follow up plan: 6 months    Evelina Dun, FNP

## 2020-05-23 LAB — CMP14+EGFR
ALT: 14 IU/L (ref 0–44)
AST: 15 IU/L (ref 0–40)
Albumin/Globulin Ratio: 1.4 (ref 1.2–2.2)
Albumin: 4.7 g/dL (ref 3.8–4.9)
Alkaline Phosphatase: 87 IU/L (ref 48–121)
BUN/Creatinine Ratio: 22 — ABNORMAL HIGH (ref 9–20)
BUN: 20 mg/dL (ref 6–24)
Bilirubin Total: 0.6 mg/dL (ref 0.0–1.2)
CO2: 20 mmol/L (ref 20–29)
Calcium: 9.7 mg/dL (ref 8.7–10.2)
Chloride: 104 mmol/L (ref 96–106)
Creatinine, Ser: 0.91 mg/dL (ref 0.76–1.27)
GFR calc Af Amer: 109 mL/min/{1.73_m2} (ref >59)
GFR calc non Af Amer: 95 mL/min/{1.73_m2} (ref >59)
Globulin, Total: 3.4 g/dL (ref 1.5–4.5)
Glucose: 99 mg/dL (ref 65–99)
Potassium: 4.4 mmol/L (ref 3.5–5.2)
Sodium: 140 mmol/L (ref 134–144)
Total Protein: 8.1 g/dL (ref 6.0–8.5)

## 2020-05-23 LAB — CBC WITH DIFFERENTIAL/PLATELET
Basophils Absolute: 0.1 10*3/uL (ref 0.0–0.2)
Basos: 1 %
EOS (ABSOLUTE): 0.2 10*3/uL (ref 0.0–0.4)
Eos: 3 %
Hematocrit: 40.9 % (ref 37.5–51.0)
Hemoglobin: 13.6 g/dL (ref 13.0–17.7)
Immature Grans (Abs): 0 10*3/uL (ref 0.0–0.1)
Immature Granulocytes: 0 %
Lymphocytes Absolute: 1.1 10*3/uL (ref 0.7–3.1)
Lymphs: 20 %
MCH: 30.4 pg (ref 26.6–33.0)
MCHC: 33.3 g/dL (ref 31.5–35.7)
MCV: 91 fL (ref 79–97)
Monocytes Absolute: 0.6 10*3/uL (ref 0.1–0.9)
Monocytes: 10 %
Neutrophils Absolute: 3.6 10*3/uL (ref 1.4–7.0)
Neutrophils: 66 %
Platelets: 243 10*3/uL (ref 150–450)
RBC: 4.48 x10E6/uL (ref 4.14–5.80)
RDW: 14.1 % (ref 11.6–15.4)
WBC: 5.5 10*3/uL (ref 3.4–10.8)

## 2020-05-23 LAB — URIC ACID: Uric Acid: 5.4 mg/dL (ref 3.8–8.4)

## 2020-06-16 ENCOUNTER — Other Ambulatory Visit: Payer: Self-pay | Admitting: Family

## 2020-06-26 ENCOUNTER — Telehealth: Payer: Self-pay | Admitting: Family

## 2020-06-26 NOTE — Telephone Encounter (Signed)
Scheduled an appt 9/3 with Christy. Offered sooner but unable to come on that day.

## 2020-06-26 NOTE — Telephone Encounter (Signed)
The pain is in the bottom of his left foot. Closer to his ankle. Making it hard to walk.

## 2020-06-26 NOTE — Telephone Encounter (Signed)
His last uric acid levels were normal. Where is his pain? It could be something else causing it.

## 2020-06-26 NOTE — Telephone Encounter (Signed)
Pts sister/guardian called stating that pt has been complaining about his gout. Says he is taking his medicine for gout but is also having to take ibuprofen also. Wants to know if Alyse Low can either increase his gout medicine or switch him to another medicine that might work better.

## 2020-06-26 NOTE — Telephone Encounter (Signed)
This could be from a heel spur. Pt needs to be seenf so we can evaluate him.

## 2020-07-15 ENCOUNTER — Other Ambulatory Visit: Payer: Self-pay | Admitting: Family

## 2020-07-15 DIAGNOSIS — I10 Essential (primary) hypertension: Secondary | ICD-10-CM

## 2020-07-17 ENCOUNTER — Other Ambulatory Visit: Payer: Self-pay

## 2020-07-17 ENCOUNTER — Encounter: Payer: Self-pay | Admitting: Family

## 2020-07-17 ENCOUNTER — Ambulatory Visit (INDEPENDENT_AMBULATORY_CARE_PROVIDER_SITE_OTHER): Payer: Medicaid Other

## 2020-07-17 ENCOUNTER — Ambulatory Visit: Payer: Medicaid Other | Admitting: Family

## 2020-07-17 VITALS — BP 138/84 | HR 97 | Temp 97.9°F | Ht 60.0 in | Wt 339.8 lb

## 2020-07-17 DIAGNOSIS — B372 Candidiasis of skin and nail: Secondary | ICD-10-CM

## 2020-07-17 DIAGNOSIS — M79671 Pain in right foot: Secondary | ICD-10-CM

## 2020-07-17 MED ORDER — KETOCONAZOLE 2 % EX CREA: 1.0000 "application " | TOPICAL_CREAM | Freq: Every day | CUTANEOUS | 0 refills | Status: DC

## 2020-07-17 NOTE — Progress Notes (Signed)
   Subjective:    Patient ID: Hayden Hayes, male    DOB: 08/19/1966, 54 y.o.   MRN: [id number]  Chief Complaint  Patient presents with  . Gout    right foot    HPI  Pt presents to the office today right heel pain that weeks ago. He thought this was gout. However, his last uric acid level was 5.4 a month ago. He took colcicine and motrin after 3-4 days his pain resolved.   He states his pain is 0 out 10.   He also reports he is having intermittent right sided pain that comes and goes. He states it is a 8 out 10 and is worse when he is laying down in bed.   Review of Systems  All other systems reviewed and are negative.      Objective:   Physical Exam Vitals reviewed.  Constitutional:      General: He is not in acute distress.    Appearance: He is well-developed. He is obese.  HENT:     Head: Normocephalic.  Eyes:     General:        Right eye: No discharge.        Left eye: No discharge.     Pupils: Pupils are equal, round, and reactive to light.  Neck:     Thyroid: No thyromegaly.  Cardiovascular:     Rate and Rhythm: Normal rate and regular rhythm.     Heart sounds: Normal heart sounds. No murmur heard.   Pulmonary:     Effort: Pulmonary effort is normal. No respiratory distress.     Breath sounds: Normal breath sounds. No wheezing.  Abdominal:     General: Bowel sounds are normal. There is no distension.     Palpations: Abdomen is soft.     Tenderness: There is no abdominal tenderness.  Musculoskeletal:        General: No tenderness. Normal range of motion.     Cervical back: Normal range of motion and neck supple.  Skin:    General: Skin is warm and dry.     Findings: Rash present. No erythema.     Comments: Under right breast erythemas, large hard mass in right lower groin  Neurological:     Mental Status: He is alert and oriented to person, place, and time.     Cranial Nerves: No cranial nerve deficit.     Deep Tendon Reflexes: Reflexes are normal and  symmetric.  Psychiatric:        Behavior: Behavior normal.        Thought Content: Thought content normal.        Judgment: Judgment normal.       BP 138/84   Pulse 97   Temp 97.9 F (36.6 C) (Temporal)   Ht 5' (1.524 m)   Wt (!) 339 lb 12.8 oz (154.1 kg)   SpO2 95%   BMI 66.36 kg/m      Assessment & Plan:  Hayden Hayes comes in today with chief complaint of Gout (right foot)   Diagnosis and orders addressed:  1. Pain of right heel X-ray pending  Motrin as needed - DG Foot Complete Right; Future  2. Candidal skin infection Keep clean and dry Do not scratch - ketoconazole (NIZORAL) 2 % cream; Apply 1 application topically daily.  Dispense: 15 g; Refill: 0     Hayden Dun, FNP

## 2020-07-17 NOTE — Patient Instructions (Signed)
Heel Spur  A heel spur is a bony growth that forms on the bottom of the heel bone (calcaneus). Heel spurs are common. They often cause inflammation in the band of tissue that connects the toes to the heel bone (plantar fascia). This may cause pain on the bottom of the foot, near the heel. Many people with plantar fasciitis also have heel spurs. However, spurs are not the cause of plantar fasciitis pain. What are the causes? The exact cause of heel spurs is not known. They may be caused by:  Pressure on the heel bone.  Bands of tissue (tendons) pulling on the heel bone. What increases the risk? You are more likely to develop this condition if you:  Are older than 40.  Are overweight.  Have wear-and-tear arthritis (osteoarthritis).  Have plantar fascia inflammation.  Participate in sports or activities that include a lot of running or jumping.  Wear poorly fitted shoes. What are the signs or symptoms? Some people have no symptoms. If you do have symptoms, they may include:  Pain in the bottom of your heel.  Pain that is worse when you first get out of bed.  Pain that gets worse after walking or standing. How is this diagnosed? This condition may be diagnosed based on:  Your symptoms and medical history.  A physical exam.  A foot X-ray. How is this treated? Treatment for this condition depends on how much pain you have. Treatment options may include:  Doing stretching exercises.  Losing weight, if necessary.  Wearing specific shoes or inserts inside of shoes (orthotics) for comfort and support.  Wearing splints on your feet while you sleep. Splints keep your feet in a position (usually 90 degrees) that should prevent and relieve the pain you feel when you first get out of bed. They also make stretching easier in the morning.  Taking over-the-counter medicine to relieve pain, such as NSAIDs.  Using high-intensity sound waves to break up the heel spur (extracorporeal  shock wave therapy).  Getting steroid injections in your heel to reduce inflammation.  Having surgery, if your heel spur causes long-term (chronic) pain. Follow these instructions at home:  Activity  Avoid activities that cause pain until you recover, or for as long as directed by your health care provider.  Do stretching exercises as directed. Stretch before exercising or being physically active. Managing pain, stiffness, and swelling  If directed, put ice on your foot: ? Put ice in a plastic bag. ? Place a towel between your skin and the bag. ? Leave the ice on for 20 minutes, 2-3 times a day.  Move your toes often to avoid stiffness and to lessen swelling.  When possible, raise (elevate) your foot above the level of your heart while you are sitting or lying down. General instructions  Take over-the-counter and prescription medicines only as told by your health care provider.  Wear supportive shoes that fit well. Wear splints, inserts, or orthotics as told by your health care provider.  If recommended, work with your health care provider to lose weight. This can relieve pressure on your foot.  Do not use any products that contain nicotine or tobacco, such as cigarettes and e-cigarettes. These can affect bone growth and healing. If you need help quitting, ask your health care provider.  Keep all follow-up visits as told by your health care provider. This is important. Contact a health care provider if:  Your pain does not go away with treatment.  Your pain gets  worse. Summary  A heel spur is a bony growth that forms on the bottom of the heel bone (calcaneus).  Heel spurs often cause inflammation in the band of tissue that connects the toes to the heel bone (plantar fascia). This may cause pain on the bottom of the foot, near the heel.  Doing stretching exercises, losing weight, wearing specific shoes or shoe inserts, wearing splints while you sleep, and taking pain  medicine may ease the pain and stiffness.  Other treatment options may include high-intensity sound waves to break up the heel spur, steroid injections, or surgery. This information is not intended to replace advice given to you by your health care provider. Make sure you discuss any questions you have with your health care provider. Document Revised: 10/18/2017 Document Reviewed: 10/18/2017 Elsevier Patient Education  2020 [employer].

## 2020-07-24 ENCOUNTER — Other Ambulatory Visit: Payer: Self-pay | Admitting: Family

## 2020-07-24 DIAGNOSIS — M79673 Pain in unspecified foot: Secondary | ICD-10-CM

## 2020-07-24 MED ORDER — PREDNISONE 10 MG (21) PO TBPK: ORAL_TABLET | ORAL | 0 refills | Status: DC

## 2020-09-14 ENCOUNTER — Other Ambulatory Visit: Payer: Self-pay

## 2020-09-14 ENCOUNTER — Encounter: Payer: Self-pay | Admitting: Family Medicine

## 2020-09-14 ENCOUNTER — Ambulatory Visit: Payer: Medicaid Other | Admitting: Family Medicine

## 2020-09-14 VITALS — BP 131/85 | HR 92 | Temp 98.1°F | Ht 60.0 in | Wt 347.5 lb

## 2020-09-14 DIAGNOSIS — M1A09X0 Idiopathic chronic gout, multiple sites, without tophus (tophi): Secondary | ICD-10-CM

## 2020-09-14 DIAGNOSIS — Z23 Encounter for immunization: Secondary | ICD-10-CM

## 2020-09-14 MED ORDER — ALLOPURINOL 300 MG PO TABS: 300.0000 mg | ORAL_TABLET | Freq: Two times a day (BID) | ORAL | 6 refills | Status: DC

## 2020-09-14 MED ORDER — ALLOPURINOL 300 MG PO TABS: 300.0000 mg | ORAL_TABLET | Freq: Every day | ORAL | 6 refills | Status: DC

## 2020-09-14 NOTE — Progress Notes (Signed)
Subjective: CC: gout PCP: Sharion Balloon, FNP  [id number] Hayes is a 54 y.o. male presenting to clinic today for:  1.  Gout of right foot Recently had a flare in his right big toe 2 weeks ago. Had a flare in right little toe 4 weeks ago. Symptoms included swelling, pain, and redness of joint. Denied fever. He is taking allopurinol 300 mg daily. With the attacks he has been taking Advil. Denies current symptoms. He needs a refill on his allopurinol today.   Relevant past medical, surgical, family, and social history reviewed and updated as indicated.  Allergies and medications reviewed and updated.  No Known Allergies Past Medical History:  Diagnosis Date  . GERD (gastroesophageal reflux disease)   . Gout   . Hyperlipidemia   . Hypertension   . Thyroid disease     Current Outpatient Medications:  .  allopurinol (ZYLOPRIM) 300 MG tablet, Take 1 tablet (300 mg total) by mouth daily., Disp: 30 tablet, Rfl: 6 .  aspirin 81 MG EC tablet, Take 81 mg by mouth daily. Swallow whole., Disp: , Rfl:  .  enalapril (VASOTEC) 2.5 MG tablet, TAKE 1 TABLET BY MOUTH EVERY DAY, Disp: 90 tablet, Rfl: 0 .  ketoconazole (NIZORAL) 2 % cream, Apply 1 application topically daily., Disp: 15 g, Rfl: 0 .  levothyroxine (SYNTHROID) 175 MCG tablet, TAKE ONE TABLET (175 MCG DOSE) BY MOUTH DAILY., Disp: 90 tablet, Rfl: 9 .  Misc. Devices MISC, New cpap supplies. DME [facility]., Disp: , Rfl:  .  omeprazole (PRILOSEC) 20 MG capsule, TAKE 1 CAPSULE BY MOUTH EVERY DAY, Disp: 90 capsule, Rfl: 1 .  QUEtiapine (SEROQUEL) 25 MG tablet, TAKE 1 TABLET BY MOUTH EVERY DAY, Disp: 30 tablet, Rfl: 6 .  simvastatin (ZOCOR) 20 MG tablet, Take 2 tablets (40 mg total) by mouth daily., Disp: 180 tablet, Rfl: 3 Social History   Socioeconomic History  . Marital status: Single    Spouse name: Not on file  . Number of children: Not on file  . Years of education: Not on file  . Highest education level: Not on file  Occupational  History  . Occupation: Disabled  Tobacco Use  . Smoking status: Never Smoker  . Smokeless tobacco: Never Used  Vaping Use  . Vaping Use: Never used  Substance and Sexual Activity  . Alcohol use: Never  . Drug use: Never  . Sexual activity: Not on file  Other Topics Concern  . Not on file  Social History Narrative   Lives alone.     Social Determinants of Health   Financial Resource Strain:   . Difficulty of Paying Living Expenses: Not on file  Food Insecurity:   . Worried About Charity fundraiser in the Last Year: Not on file  . Ran Out of Food in the Last Year: Not on file  Transportation Needs:   . Lack of Transportation (Medical): Not on file  . Lack of Transportation (Non-Medical): Not on file  Physical Activity:   . Days of Exercise per Week: Not on file  . Minutes of Exercise per Session: Not on file  Stress:   . Feeling of Stress : Not on file  Social Connections:   . Frequency of Communication with Friends and Family: Not on file  . Frequency of Social Gatherings with Friends and Family: Not on file  . Attends Religious Services: Not on file  . Active Member of Clubs or Organizations: Not on file  . Attends Club  or Organization Meetings: Not on file  . Marital Status: Not on file  Intimate Partner Violence:   . Fear of Current or Ex-Partner: Not on file  . Emotionally Abused: Not on file  . Physically Abused: Not on file  . Sexually Abused: Not on file   Family History  Problem Relation Age of Onset  . Heart disease Mother 63       CHF, hemachromatosis  . Epilepsy Mother   . Heart disease Father 66       Heart attacks and stents in neck    Review of Systems  Per HPI.   Objective: Office vital signs reviewed. BP 131/85   Pulse 92   Temp 98.1 F (36.7 C) (Temporal)   Ht 5' (1.524 m)   Wt (!) 347 lb 8 oz (157.6 kg)   BMI 67.87 kg/m   Physical Examination:  Physical Exam Vitals and nursing note reviewed.  Constitutional:      General: He is  not in acute distress.    Appearance: He is not ill-appearing, toxic-appearing or diaphoretic.  Cardiovascular:     Rate and Rhythm: Normal rate and regular rhythm.     Heart sounds: Normal heart sounds. No murmur heard.   Pulmonary:     Effort: Pulmonary effort is normal. No respiratory distress.     Breath sounds: Normal breath sounds.  Neurological:     General: No focal deficit present.     Mental Status: He is alert and oriented to person, place, and time.  Psychiatric:        Mood and Affect: Mood normal.        Behavior: Behavior normal.      Results for orders placed or performed in visit on 05/22/20  CMP14+EGFR  Result Value Ref Range   Glucose 99 65 - 99 mg/dL   BUN 20 6 - 24 mg/dL   Creatinine, Ser 0.91 0.76 - 1.27 mg/dL   GFR calc non Af Amer 95 >59 mL/min/1.73   GFR calc Af Amer 109 >59 mL/min/1.73   BUN/Creatinine Ratio 22 (H) 9 - 20   Sodium 140 134 - 144 mmol/L   Potassium 4.4 3.5 - 5.2 mmol/L   Chloride 104 96 - 106 mmol/L   CO2 20 20 - 29 mmol/L   Calcium 9.7 8.7 - 10.2 mg/dL   Total Protein 8.1 6.0 - 8.5 g/dL   Albumin 4.7 3.8 - 4.9 g/dL   Globulin, Total 3.4 1.5 - 4.5 g/dL   Albumin/Globulin Ratio 1.4 1.2 - 2.2   Bilirubin Total 0.6 0.0 - 1.2 mg/dL   Alkaline Phosphatase 87 48 - 121 IU/L   AST 15 0 - 40 IU/L   ALT 14 0 - 44 IU/L  CBC with Differential/Platelet  Result Value Ref Range   WBC 5.5 3.4 - 10.8 x10E3/uL   RBC 4.48 4.14 - 5.80 x10E6/uL   Hemoglobin 13.6 13.0 - 17.7 g/dL   Hematocrit 40.9 37.5 - 51.0 %   MCV 91 79 - 97 fL   MCH 30.4 26.6 - 33.0 pg   MCHC 33.3 31 - 35 g/dL   RDW 14.1 11.6 - 15.4 %   Platelets 243 150 - 450 x10E3/uL   Neutrophils 66 Not Estab. %   Lymphs 20 Not Estab. %   Monocytes 10 Not Estab. %   Eos 3 Not Estab. %   Basos 1 Not Estab. %   Neutrophils Absolute 3.6 1.40 - 7.00 x10E3/uL   Lymphocytes Absolute 1.1 0 -  3 x10E3/uL   Monocytes Absolute 0.6 0 - 0 x10E3/uL   EOS (ABSOLUTE) 0.2 0.0 - 0.4 x10E3/uL    Basophils Absolute 0.1 0 - 0 x10E3/uL   Immature Granulocytes 0 Not Estab. %   Immature Grans (Abs) 0.0 0.0 - 0.1 x10E3/uL  Uric acid  Result Value Ref Range   Uric Acid 5.4 3.8 - 8.4 mg/dL     Assessment/ Plan: Hayden was seen today for foot pain.  Diagnoses and all orders for this visit:  Chronic gout of multiple sites, unspecified cause Currently asymptomatic at exam. Last uric acid level was <6, however patient continues to be symptomatic. Discussed taking 1.5 tablets for a week then increasing to 1 tablet BID if needed for max dose of 600 mg. Gout handout given. Return to office for new or worsening symptoms, or if symptoms persist.   -     allopurinol (ZYLOPRIM) 300 MG tablet; Take 1 tablet (300 mg total) by mouth 2 (two) times daily.  Need for immunization against influenza -     Flu Vaccine QUAD 36+ mos IM  Follow up as needed with PCP.   The above assessment and management plan was discussed with the patient. The patient verbalized understanding of and has agreed to the management plan. Patient is aware to call the clinic if symptoms persist or worsen. Patient is aware when to return to the clinic for a follow-up visit. Patient educated on when it is appropriate to go to the emergency department.   Marjorie Smolder, FNP-C [facility] [address] [place], [place] [address] [phone]

## 2020-09-14 NOTE — Patient Instructions (Signed)
Take 1.5 tablets of allopurinol for 1 week, then can increase to 2 tablets.   Gout  Gout is painful swelling of your joints. Gout is a type of arthritis. It is caused by having too much uric acid in your body. Uric acid is a chemical that is made when your body breaks down substances called purines. If your body has too much uric acid, sharp crystals can form and build up in your joints. This causes pain and swelling. Gout attacks can happen quickly and be very painful (acute gout). Over time, the attacks can affect more joints and happen more often (chronic gout). What are the causes?  Too much uric acid in your blood. This can happen because: ? Your kidneys do not remove enough uric acid from your blood. ? Your body makes too much uric acid. ? You eat too many foods that are high in purines. These foods include organ meats, some seafood, and beer.  Trauma or stress. What increases the risk?  Having a family history of gout.  Being male and middle-aged.  Being male and having gone through menopause.  Being very overweight (obese).  Drinking alcohol, especially beer.  Not having enough water in the body (being dehydrated).  Losing weight too quickly.  Having an organ transplant.  Having lead poisoning.  Taking certain medicines.  Having kidney disease.  Having a skin condition called psoriasis. What are the signs or symptoms? An attack of acute gout usually happens in just one joint. The most common place is the big toe. Attacks often start at night. Other joints that may be affected include joints of the feet, ankle, knee, fingers, wrist, or elbow. Symptoms of an attack may include:  Very bad pain.  Warmth.  Swelling.  Stiffness.  Shiny, red, or purple skin.  Tenderness. The affected joint may be very painful to touch.  Chills and fever. Chronic gout may cause symptoms more often. More joints may be involved. You may also have white or yellow lumps (tophi) on  your hands or feet or in other areas near your joints. How is this treated?  Treatment for this condition has two phases: treating an acute attack and preventing future attacks.  Acute gout treatment may include: ? NSAIDs. ? Steroids. These are taken by mouth or injected into a joint. ? Colchicine. This medicine relieves pain and swelling. It can be given by mouth or through an IV tube.  Preventive treatment may include: ? Taking small doses of NSAIDs or colchicine daily. ? Using a medicine that reduces uric acid levels in your blood. ? Making changes to your diet. You may need to see a food expert (dietitian) about what to eat and drink to prevent gout. Follow these instructions at home: During a gout attack   If told, put ice on the painful area: ? Put ice in a plastic bag. ? Place a towel between your skin and the bag. ? Leave the ice on for 20 minutes, 2-3 times a day.  Raise (elevate) the painful joint above the level of your heart as often as you can.  Rest the joint as much as possible. If the joint is in your leg, you may be given crutches.  Follow instructions from your doctor about what you cannot eat or drink. Avoiding future gout attacks  Eat a low-purine diet. Avoid foods and drinks such as: ? Liver. ? Kidney. ? Anchovies. ? Asparagus. ? Herring. ? Mushrooms. ? Mussels. ? Beer.  Stay at a  healthy weight. If you want to lose weight, talk with your doctor. Do not lose weight too fast.  Start or continue an exercise plan as told by your doctor. Eating and drinking  Drink enough fluids to keep your pee (urine) pale yellow.  If you drink alcohol: ? Limit how much you use to:  0-1 drink a day for women.  0-2 drinks a day for men. ? Be aware of how much alcohol is in your drink. In the [place] one drink equals one 12 oz bottle of beer (355 mL), one 5 oz glass of wine (148 mL), or one 1 oz glass of hard liquor (44 mL). General instructions  Take  over-the-counter and prescription medicines only as told by your doctor.  Do not drive or use heavy machinery while taking prescription pain medicine.  Return to your normal activities as told by your doctor. Ask your doctor what activities are safe for you.  Keep all follow-up visits as told by your doctor. This is important. Contact a doctor if:  You have another gout attack.  You still have symptoms of a gout attack after 10 days of treatment.  You have problems (side effects) because of your medicines.  You have chills or a fever.  You have burning pain when you pee (urinate).  You have pain in your lower back or belly. Get help right away if:  You have very bad pain.  Your pain cannot be controlled.  You cannot pee. Summary  Gout is painful swelling of the joints.  The most common site of pain is the big toe, but it can affect other joints.  Medicines and avoiding some foods can help to prevent and treat gout attacks. This information is not intended to replace advice given to you by your health care provider. Make sure you discuss any questions you have with your health care provider. Document Revised: 05/23/2018 Document Reviewed: 05/23/2018 Elsevier Patient Education  [place].

## 2020-10-16 ENCOUNTER — Other Ambulatory Visit: Payer: Self-pay | Admitting: Family

## 2020-10-16 DIAGNOSIS — I10 Essential (primary) hypertension: Secondary | ICD-10-CM

## 2020-10-16 DIAGNOSIS — B372 Candidiasis of skin and nail: Secondary | ICD-10-CM

## 2020-11-24 ENCOUNTER — Ambulatory Visit: Payer: Medicaid Other | Admitting: Family

## 2020-11-24 ENCOUNTER — Encounter: Payer: Self-pay | Admitting: Family

## 2020-11-24 ENCOUNTER — Other Ambulatory Visit: Payer: Self-pay

## 2020-11-24 VITALS — BP 136/85 | HR 79 | Temp 97.7°F | Ht 60.0 in | Wt 345.0 lb

## 2020-11-24 DIAGNOSIS — Z9989 Dependence on other enabling machines and devices: Secondary | ICD-10-CM

## 2020-11-24 DIAGNOSIS — M793 Panniculitis, unspecified: Secondary | ICD-10-CM | POA: Diagnosis not present

## 2020-11-24 DIAGNOSIS — I10 Essential (primary) hypertension: Secondary | ICD-10-CM | POA: Diagnosis not present

## 2020-11-24 DIAGNOSIS — Z1211 Encounter for screening for malignant neoplasm of colon: Secondary | ICD-10-CM

## 2020-11-24 DIAGNOSIS — F411 Generalized anxiety disorder: Secondary | ICD-10-CM

## 2020-11-24 DIAGNOSIS — E785 Hyperlipidemia, unspecified: Secondary | ICD-10-CM

## 2020-11-24 DIAGNOSIS — G4733 Obstructive sleep apnea (adult) (pediatric): Secondary | ICD-10-CM

## 2020-11-24 DIAGNOSIS — F321 Major depressive disorder, single episode, moderate: Secondary | ICD-10-CM

## 2020-11-24 DIAGNOSIS — E039 Hypothyroidism, unspecified: Secondary | ICD-10-CM | POA: Diagnosis not present

## 2020-11-24 DIAGNOSIS — M109 Gout, unspecified: Secondary | ICD-10-CM

## 2020-11-24 DIAGNOSIS — Z1159 Encounter for screening for other viral diseases: Secondary | ICD-10-CM

## 2020-11-24 NOTE — Progress Notes (Signed)
Subjective:    Patient ID: Hayden Hayes, male    DOB: Feb 21, 1966, 55 y.o.   MRN: [id number]  Chief Complaint  Patient presents with  . Hypertension  . Hypothyroidism    6 MTH FOLLOW UP    PT presents to the office today for chronic follow up. Pt has seen General Surgery of large panniculitis, but was told he was not a candidate for surgery.   He has lost 51 lb 2021. This has been at a stand still over the last 6 months. Hypertension This is a chronic problem. The problem has been resolved since onset. Associated symptoms include anxiety, malaise/fatigue and shortness of breath. Pertinent negatives include no peripheral edema. Risk factors for coronary artery disease include dyslipidemia, obesity, male gender and sedentary lifestyle. The current treatment provides moderate improvement. Identifiable causes of hypertension include a thyroid problem.  Thyroid Problem Presents for follow-up visit. Patient reports no anxiety, constipation, depressed mood, diarrhea, dry skin or fatigue. The symptoms have been stable. His past medical history is significant for hyperlipidemia.  Hyperlipidemia This is a chronic problem. The current episode started more than 1 year ago. Associated symptoms include shortness of breath. Current antihyperlipidemic treatment includes statins. The current treatment provides moderate improvement of lipids. Risk factors for coronary artery disease include dyslipidemia, hypertension, male sex and a sedentary lifestyle.  Anxiety Presents for follow-up visit. Symptoms include excessive worry, irritability and shortness of breath. Patient reports no depressed mood, nervous/anxious behavior, panic or restlessness. Symptoms occur most days. The severity of symptoms is moderate. The quality of sleep is good.    Depression        This is a chronic problem.  The current episode started more than 1 year ago.   The onset quality is gradual.   The problem occurs intermittently.   Associated symptoms include no fatigue, no helplessness, no hopelessness, not irritable, no restlessness and not sad.( "not right now" )  Past medical history includes thyroid problem and anxiety.    OSA  Uses CPAP nightly. Stable.  Gout He reports he was seen several months ago with a gout flare up. He reports his allopurinol was increased to 300 mg BID from daily.   Review of Systems  Constitutional: Positive for irritability and malaise/fatigue. Negative for fatigue.  Respiratory: Positive for shortness of breath.   Gastrointestinal: Negative for constipation and diarrhea.  Psychiatric/Behavioral: Positive for depression. The patient is not nervous/anxious.   All other systems reviewed and are negative.      Objective:   Physical Exam Vitals reviewed.  Constitutional:      General: He is not irritable.He is not in acute distress.    Appearance: He is well-developed and well-nourished. He is obese.  HENT:     Head: Normocephalic.     Right Ear: Tympanic membrane normal.     Left Ear: Tympanic membrane normal.     Mouth/Throat:     Mouth: Oropharynx is clear and moist.  Eyes:     General:        Right eye: No discharge.        Left eye: No discharge.     Pupils: Pupils are equal, round, and reactive to light.  Neck:     Thyroid: No thyromegaly.  Cardiovascular:     Rate and Rhythm: Normal rate and regular rhythm.     Pulses: Intact distal pulses.     Heart sounds: Normal heart sounds. No murmur heard.   Pulmonary:  Effort: Pulmonary effort is normal. No respiratory distress.     Breath sounds: Normal breath sounds. No wheezing.  Abdominal:     General: Bowel sounds are normal. There is no distension.     Palpations: Abdomen is soft.     Tenderness: There is no abdominal tenderness.     Comments: Large panniculus that is hard and hanging from groin,   Musculoskeletal:        General: No tenderness. Normal range of motion.     Cervical back: Normal range of  motion and neck supple.     Right lower leg: Edema (trace) present.     Left lower leg: Edema (trace) present.  Skin:    General: Skin is warm and dry.     Coloration: Skin is pale.     Findings: No erythema or rash.  Neurological:     Mental Status: He is alert and oriented to person, place, and time.     Cranial Nerves: No cranial nerve deficit.     Deep Tendon Reflexes: Reflexes are normal and symmetric.  Psychiatric:        Mood and Affect: Mood and affect normal.        Behavior: Behavior normal.        Thought Content: Thought content normal.        Judgment: Judgment normal.       BP 136/85   Pulse 79   Temp 97.7 F (36.5 C) (Temporal)   Ht 5' (1.524 m)   Wt (!) 345 lb (156.5 kg)   SpO2 96%   BMI 67.38 kg/m      Assessment & Plan:  Hayden Hayes comes in today with chief complaint of Hypertension and Hypothyroidism (6 MTH FOLLOW UP)   Diagnosis and orders addressed:  1. Essential hypertension with goal blood pressure less than 130/80 - CMP14+EGFR - CBC with Differential/Platelet  2. OSA on CPAP - CMP14+EGFR - CBC with Differential/Platelet  3. Hypothyroidism, unspecified type - CMP14+EGFR - CBC with Differential/Platelet  4. Panniculitis - CMP14+EGFR - CBC with Differential/Platelet  5. Super-super obese with giant panniculus - CMP14+EGFR - CBC with Differential/Platelet  6. Hyperlipidemia, unspecified hyperlipidemia type - CMP14+EGFR - CBC with Differential/Platelet  7. Acute gout of multiple sites, unspecified cause - CMP14+EGFR - CBC with Differential/Platelet - Uric acid  8. GAD (generalized anxiety disorder) - CMP14+EGFR - CBC with Differential/Platelet  9. Depression, major, single episode, moderate (HCC) - CMP14+EGFR - CBC with Differential/Platelet   Labs pending Health Maintenance reviewed Diet and exercise encouraged  Follow up plan: 4 months    Evelina Dun, FNP

## 2020-11-24 NOTE — Patient Instructions (Signed)
Gout  Gout is a condition that causes painful swelling of the joints. Gout is a type of inflammation of the joints (arthritis). This condition is caused by having too much uric acid in the body. Uric acid is a chemical that forms when the body breaks down substances called purines. Purines are important for building body proteins. When the body has too much uric acid, sharp crystals can form and build up inside the joints. This causes pain and swelling. Gout attacks can happen quickly and may be very painful (acute gout). Over time, the attacks can affect more joints and become more frequent (chronic gout). Gout can also cause uric acid to build up under the skin and inside the kidneys. What are the causes? This condition is caused by too much uric acid in your blood. This can happen because:  Your kidneys do not remove enough uric acid from your blood. This is the most common cause.  Your body makes too much uric acid. This can happen with some cancers and cancer treatments. It can also occur if your body is breaking down too many red blood cells (hemolytic anemia).  You eat too many foods that are high in purines. These foods include organ meats and some seafood. Alcohol, especially beer, is also high in purines. A gout attack may be triggered by trauma or stress. What increases the risk? You are more likely to develop this condition if you:  Have a family history of gout.  Are male and middle-aged.  Are male and have gone through menopause.  Are obese.  Frequently drink alcohol, especially beer.  Are dehydrated.  Lose weight too quickly.  Have an organ transplant.  Have lead poisoning.  Take certain medicines, including aspirin, cyclosporine, diuretics, levodopa, and niacin.  Have kidney disease.  Have a skin condition called psoriasis. What are the signs or symptoms? An attack of acute gout happens quickly. It usually occurs in just one joint. The most common place is  the big toe. Attacks often start at night. Other joints that may be affected include joints of the feet, ankle, knee, fingers, wrist, or elbow. Symptoms of this condition may include:  Severe pain.  Warmth.  Swelling.  Stiffness.  Tenderness. The affected joint may be very painful to touch.  Shiny, red, or purple skin.  Chills and fever. Chronic gout may cause symptoms more frequently. More joints may be involved. You may also have white or yellow lumps (tophi) on your hands or feet or in other areas near your joints.   How is this diagnosed? This condition is diagnosed based on your symptoms, medical history, and physical exam. You may have tests, such as:  Blood tests to measure uric acid levels.  Removal of joint fluid with a thin needle (aspiration) to look for uric acid crystals.  X-rays to look for joint damage. How is this treated? Treatment for this condition has two phases: treating an acute attack and preventing future attacks. Acute gout treatment may include medicines to reduce pain and swelling, including:  NSAIDs.  Steroids. These are strong anti-inflammatory medicines that can be taken by mouth (orally) or injected into a joint.  Colchicine. This medicine relieves pain and swelling when it is taken soon after an attack. It can be given by mouth or through an IV. Preventive treatment may include:  Daily use of smaller doses of NSAIDs or colchicine.  Use of a medicine that reduces uric acid levels in your blood.  Changes to your diet.   You may need to see a dietitian about what to eat and drink to prevent gout. Follow these instructions at home: During a gout attack  If directed, put ice on the affected area: ? Put ice in a plastic bag. ? Place a towel between your skin and the bag. ? Leave the ice on for 20 minutes, 2-3 times a day.  Raise (elevate) the affected joint above the level of your heart as often as possible.  Rest the joint as much as possible.  If the affected joint is in your leg, you may be given crutches to use.  Follow instructions from your health care provider about eating or drinking restrictions.   Avoiding future gout attacks  Follow a low-purine diet as told by your dietitian or health care provider. Avoid foods and drinks that are high in purines, including liver, kidney, anchovies, asparagus, herring, mushrooms, mussels, and beer.  Maintain a healthy weight or lose weight if you are overweight. If you want to lose weight, talk with your health care provider. It is important that you do not lose weight too quickly.  Start or maintain an exercise program as told by your health care provider. Eating and drinking  Drink enough fluids to keep your urine pale yellow.  If you drink alcohol: ? Limit how much you use to:  0-1 drink a day for women.  0-2 drinks a day for men. ? Be aware of how much alcohol is in your drink. In the [place] one drink equals one 12 oz bottle of beer (355 mL) one 5 oz glass of wine (148 mL), or one 1 oz glass of hard liquor (44 mL). General instructions  Take over-the-counter and prescription medicines only as told by your health care provider.  Do not drive or use heavy machinery while taking prescription pain medicine.  Return to your normal activities as told by your health care provider. Ask your health care provider what activities are safe for you.  Keep all follow-up visits as told by your health care provider. This is important. Contact a health care provider if you have:  Another gout attack.  Continuing symptoms of a gout attack after 10 days of treatment.  Side effects from your medicines.  Chills or a fever.  Burning pain when you urinate.  Pain in your lower back or belly. Get help right away if you:  Have severe or uncontrolled pain.  Cannot urinate. Summary  Gout is painful swelling of the joints caused by inflammation.  The most common site of pain is the big  toe, but it can affect other joints in the body.  Medicines and dietary changes can help to prevent and treat gout attacks. This information is not intended to replace advice given to you by your health care provider. Make sure you discuss any questions you have with your health care provider. Document Revised: 05/23/2018 Document Reviewed: 05/23/2018 Elsevier Patient Education  2021 [employer] Inc.  

## 2020-11-25 LAB — CBC WITH DIFFERENTIAL/PLATELET
Basophils Absolute: 0 10*3/uL (ref 0.0–0.2)
Basos: 0 %
EOS (ABSOLUTE): 0.2 10*3/uL (ref 0.0–0.4)
Eos: 4 %
Hematocrit: 41.7 % (ref 37.5–51.0)
Hemoglobin: 13.6 g/dL (ref 13.0–17.7)
Immature Grans (Abs): 0 10*3/uL (ref 0.0–0.1)
Immature Granulocytes: 0 %
Lymphocytes Absolute: 1.1 10*3/uL (ref 0.7–3.1)
Lymphs: 21 %
MCH: 29.8 pg (ref 26.6–33.0)
MCHC: 32.6 g/dL (ref 31.5–35.7)
MCV: 91 fL (ref 79–97)
Monocytes Absolute: 0.4 10*3/uL (ref 0.1–0.9)
Monocytes: 7 %
Neutrophils Absolute: 3.4 10*3/uL (ref 1.4–7.0)
Neutrophils: 68 %
Platelets: 234 10*3/uL (ref 150–450)
RBC: 4.56 x10E6/uL (ref 4.14–5.80)
RDW: 14.4 % (ref 11.6–15.4)
WBC: 5 10*3/uL (ref 3.4–10.8)

## 2020-11-25 LAB — CMP14+EGFR
ALT: 13 IU/L (ref 0–44)
AST: 18 IU/L (ref 0–40)
Albumin/Globulin Ratio: 1.3 (ref 1.2–2.2)
Albumin: 4.6 g/dL (ref 3.8–4.9)
Alkaline Phosphatase: 80 IU/L (ref 44–121)
BUN/Creatinine Ratio: 20 (ref 9–20)
BUN: 17 mg/dL (ref 6–24)
Bilirubin Total: 0.4 mg/dL (ref 0.0–1.2)
CO2: 23 mmol/L (ref 20–29)
Calcium: 9.8 mg/dL (ref 8.7–10.2)
Chloride: 104 mmol/L (ref 96–106)
Creatinine, Ser: 0.86 mg/dL (ref 0.76–1.27)
GFR calc Af Amer: 113 mL/min/{1.73_m2} (ref >59)
GFR calc non Af Amer: 98 mL/min/{1.73_m2} (ref >59)
Globulin, Total: 3.5 g/dL (ref 1.5–4.5)
Glucose: 99 mg/dL (ref 65–99)
Potassium: 4.2 mmol/L (ref 3.5–5.2)
Sodium: 140 mmol/L (ref 134–144)
Total Protein: 8.1 g/dL (ref 6.0–8.5)

## 2020-11-25 LAB — HEPATITIS C ANTIBODY: Hep C Virus Ab: <0.1 s/co ratio (ref 0.0–0.9)

## 2020-11-25 LAB — URIC ACID: Uric Acid: 3.9 mg/dL (ref 3.8–8.4)

## 2021-01-11 ENCOUNTER — Other Ambulatory Visit: Payer: Self-pay | Admitting: Family

## 2021-01-11 DIAGNOSIS — I10 Essential (primary) hypertension: Secondary | ICD-10-CM

## 2021-01-13 ENCOUNTER — Other Ambulatory Visit: Payer: Self-pay | Admitting: Family

## 2021-01-13 DIAGNOSIS — B372 Candidiasis of skin and nail: Secondary | ICD-10-CM

## 2021-01-13 DIAGNOSIS — E785 Hyperlipidemia, unspecified: Secondary | ICD-10-CM

## 2021-03-17 ENCOUNTER — Encounter: Payer: Self-pay | Admitting: Family Medicine

## 2021-03-22 ENCOUNTER — Other Ambulatory Visit: Payer: Self-pay

## 2021-03-22 ENCOUNTER — Ambulatory Visit: Payer: Medicaid Other | Admitting: Family

## 2021-03-22 ENCOUNTER — Encounter: Payer: Self-pay | Admitting: Family

## 2021-03-22 VITALS — BP 139/87 | HR 80 | Temp 98.9°F | Ht 60.0 in | Wt 351.6 lb

## 2021-03-22 DIAGNOSIS — G4733 Obstructive sleep apnea (adult) (pediatric): Secondary | ICD-10-CM

## 2021-03-22 DIAGNOSIS — E039 Hypothyroidism, unspecified: Secondary | ICD-10-CM

## 2021-03-22 DIAGNOSIS — M793 Panniculitis, unspecified: Secondary | ICD-10-CM

## 2021-03-22 DIAGNOSIS — Z9989 Dependence on other enabling machines and devices: Secondary | ICD-10-CM

## 2021-03-22 DIAGNOSIS — F411 Generalized anxiety disorder: Secondary | ICD-10-CM

## 2021-03-22 DIAGNOSIS — E785 Hyperlipidemia, unspecified: Secondary | ICD-10-CM

## 2021-03-22 DIAGNOSIS — I10 Essential (primary) hypertension: Secondary | ICD-10-CM | POA: Diagnosis not present

## 2021-03-22 DIAGNOSIS — F321 Major depressive disorder, single episode, moderate: Secondary | ICD-10-CM

## 2021-03-22 NOTE — Progress Notes (Signed)
Subjective:    Patient ID: Hayden Hayes, male    DOB: 1966/01/21, 55 y.o.   MRN: [id number]  Chief Complaint  Patient presents with  . Medical Management of Chronic Issues   PT presents to the office today for chronic follow up.Pt has seen General Surgery of large panniculitis, but was told he was not a candidate for surgery.  Hypertension This is a chronic problem. The current episode started more than 1 year ago. The problem has been resolved since onset. The problem is controlled. Associated symptoms include anxiety, malaise/fatigue and peripheral edema ("slight"). Pertinent negatives include no shortness of breath. Risk factors for coronary artery disease include dyslipidemia and sedentary lifestyle. The current treatment provides moderate improvement. There is no history of CVA or heart failure. Identifiable causes of hypertension include a thyroid problem.  Thyroid Problem Presents for follow-up visit. Symptoms include anxiety and fatigue. Patient reports no depressed mood or diarrhea. The symptoms have been stable. His past medical history is significant for hyperlipidemia. There is no history of heart failure.  Hyperlipidemia This is a chronic problem. The current episode started more than 1 year ago. The problem is controlled. Exacerbating diseases include obesity. Pertinent negatives include no shortness of breath. Current antihyperlipidemic treatment includes statins. The current treatment provides moderate improvement of lipids. Risk factors for coronary artery disease include dyslipidemia, diabetes mellitus, male sex, hypertension and a sedentary lifestyle.  Anxiety Presents for follow-up visit. Symptoms include excessive worry, irritability, nervous/anxious behavior and restlessness. Patient reports no depressed mood or shortness of breath. Symptoms occur occasionally. The severity of symptoms is moderate. The quality of sleep is good.    Depression        This is a chronic  problem.  The current episode started more than 1 year ago.   The onset quality is gradual.   The problem occurs intermittently.  Associated symptoms include fatigue and restlessness.  Associated symptoms include no helplessness, no hopelessness and not irritable.  Past medical history includes thyroid problem and anxiety.    OSA Using CPAP nightly. Stable    Review of Systems  Constitutional: Positive for fatigue, irritability and malaise/fatigue.  Respiratory: Negative for shortness of breath.   Gastrointestinal: Negative for diarrhea.  Psychiatric/Behavioral: Positive for depression. The patient is nervous/anxious.   All other systems reviewed and are negative.      Objective:   Physical Exam Vitals reviewed.  Constitutional:      General: He is not irritable.He is not in acute distress.    Appearance: He is well-developed. He is obese.  HENT:     Head: Normocephalic.     Right Ear: Tympanic membrane normal.     Left Ear: Tympanic membrane normal.  Eyes:     General:        Right eye: No discharge.        Left eye: No discharge.     Pupils: Pupils are equal, round, and reactive to light.  Neck:     Thyroid: No thyromegaly.  Cardiovascular:     Rate and Rhythm: Normal rate and regular rhythm.     Heart sounds: Normal heart sounds. No murmur heard.   Pulmonary:     Effort: Pulmonary effort is normal. No respiratory distress.     Breath sounds: Normal breath sounds. No wheezing.  Abdominal:     General: Bowel sounds are normal. There is no distension.     Palpations: Abdomen is soft.     Tenderness: There is no abdominal  tenderness.  Musculoskeletal:        General: No tenderness. Normal range of motion.     Cervical back: Normal range of motion and neck supple.  Skin:    General: Skin is warm and dry.     Findings: No erythema or rash.     Comments: Large panniculus that is hard and hanging from groin,  Neurological:     Mental Status: He is alert and oriented to  person, place, and time.     Cranial Nerves: No cranial nerve deficit.     Deep Tendon Reflexes: Reflexes are normal and symmetric.  Psychiatric:        Behavior: Behavior normal.        Thought Content: Thought content normal.        Judgment: Judgment normal.          BP 139/87   Pulse 80   Temp 98.9 F (37.2 C) (Temporal)   Ht 5' (1.524 m)   Wt (!) 351 lb 9.6 oz (159.5 kg)   SpO2 100%   BMI 68.67 kg/m   Assessment & Plan:  Hayden Hayes comes in today with chief complaint of Medical Management of Chronic Issues   Diagnosis and orders addressed:  1. Essential hypertension with goal blood pressure less than 130/80 - BMP8+EGFR  2. OSA on CPAP - BMP8+EGFR  3. Hypothyroidism, unspecified type - BMP8+EGFR - TSH  4. Panniculitis Long discussion about follow up with surgeon to discuss removal - BMP8+EGFR  5. Depression, major, single episode, moderate (HCC) - BMP8+EGFR  6. GAD (generalized anxiety disorder) - BMP8+EGFR  7. Hyperlipidemia, unspecified hyperlipidemia type - BMP8+EGFR  8. Super-super obese with giant panniculus - BMP8+EGFR   Labs pending Health Maintenance reviewed Diet and exercise encouraged  Follow up plan: 6 months    Evelina Dun, FNP

## 2021-03-22 NOTE — Patient Instructions (Signed)
Calorie Counting for Weight Loss Calories are units of energy. Your body needs a certain number of calories from food to keep going throughout the day. When you eat or drink more calories than your body needs, your body stores the extra calories mostly as fat. When you eat or drink fewer calories than your body needs, your body burns fat to get the energy it needs. Calorie counting means keeping track of how many calories you eat and drink each day. Calorie counting can be helpful if you need to lose weight. If you eat fewer calories than your body needs, you should lose weight. Ask your health care provider what a healthy weight is for you. For calorie counting to work, you will need to eat the right number of calories each day to lose a healthy amount of weight per week. A dietitian can help you figure out how many calories you need in a day and will suggest ways to reach your calorie goal.  A healthy amount of weight to lose each week is usually 1-2 lb (0.5-0.9 kg). This usually means that your daily calorie intake should be reduced by 500-750 calories.  Eating 1,200-1,500 calories a day can help most women lose weight.  Eating 1,500-1,800 calories a day can help most men lose weight. What do I need to know about calorie counting? Work with your health care provider or dietitian to determine how many calories you should get each day. To meet your daily calorie goal, you will need to:  Find out how many calories are in each food that you would like to eat. Try to do this before you eat.  Decide how much of the food you plan to eat.  Keep a food log. Do this by writing down what you ate and how many calories it had. To successfully lose weight, it is important to balance calorie counting with a healthy lifestyle that includes regular activity. Where do I find calorie information? The number of calories in a food can be found on a Nutrition Facts label. If a food does not have a Nutrition Facts  label, try to look up the calories online or ask your dietitian for help. Remember that calories are listed per serving. If you choose to have more than one serving of a food, you will have to multiply the calories per serving by the number of servings you plan to eat. For example, the label on a package of bread might say that a serving size is 1 slice and that there are 90 calories in a serving. If you eat 1 slice, you will have eaten 90 calories. If you eat 2 slices, you will have eaten 180 calories.   How do I keep a food log? After each time that you eat, record the following in your food log as soon as possible:  What you ate. Be sure to include toppings, sauces, and other extras on the food.  How much you ate. This can be measured in cups, ounces, or number of items.  How many calories were in each food and drink.  The total number of calories in the food you ate. Keep your food log near you, such as in a pocket-sized notebook or on an app or website on your mobile phone. Some programs will calculate calories for you and show you how many calories you have left to meet your daily goal. What are some portion-control tips?  Know how many calories are in a serving. This will   help you know how many servings you can have of a certain food.  Use a measuring cup to measure serving sizes. You could also try weighing out portions on a kitchen scale. With time, you will be able to estimate serving sizes for some foods.  Take time to put servings of different foods on your favorite plates or in your favorite bowls and cups so you know what a serving looks like.  Try not to eat straight from a food's packaging, such as from a bag or box. Eating straight from the package makes it hard to see how much you are eating and can lead to overeating. Put the amount you would like to eat in a cup or on a plate to make sure you are eating the right portion.  Use smaller plates, glasses, and bowls for smaller  portions and to prevent overeating.  Try not to multitask. For example, avoid watching TV or using your computer while eating. If it is time to eat, sit down at a table and enjoy your food. This will help you recognize when you are full. It will also help you be more mindful of what and how much you are eating. What are tips for following this plan? Reading food labels  Check the calorie count compared with the serving size. The serving size may be smaller than what you are used to eating.  Check the source of the calories. Try to choose foods that are high in protein, fiber, and vitamins, and low in saturated fat, trans fat, and sodium. Shopping  Read nutrition labels while you shop. This will help you make healthy decisions about which foods to buy.  Pay attention to nutrition labels for low-fat or fat-free foods. These foods sometimes have the same number of calories or more calories than the full-fat versions. They also often have added sugar, starch, or salt to make up for flavor that was removed with the fat.  Make a grocery list of lower-calorie foods and stick to it. Cooking  Try to cook your favorite foods in a healthier way. For example, try baking instead of frying.  Use low-fat dairy products. Meal planning  Use more fruits and vegetables. One-half of your plate should be fruits and vegetables.  Include lean proteins, such as chicken, turkey, and fish. Lifestyle Each week, aim to do one of the following:  150 minutes of moderate exercise, such as walking.  75 minutes of vigorous exercise, such as running. General information  Know how many calories are in the foods you eat most often. This will help you calculate calorie counts faster.  Find a way of tracking calories that works for you. Get creative. Try different apps or programs if writing down calories does not work for you. What foods should I eat?  Eat nutritious foods. It is better to have a nutritious,  high-calorie food, such as an avocado, than a food with few nutrients, such as a bag of potato chips.  Use your calories on foods and drinks that will fill you up and will not leave you hungry soon after eating. ? Examples of foods that fill you up are nuts and nut butters, vegetables, lean proteins, and high-fiber foods such as whole grains. High-fiber foods are foods with more than 5 g of fiber per serving.  Pay attention to calories in drinks. Low-calorie drinks include water and unsweetened drinks. The items listed above may not be a complete list of foods and beverages you can eat.   Contact a dietitian for more information.   What foods should I limit? Limit foods or drinks that are not good sources of vitamins, minerals, or protein or that are high in unhealthy fats. These include:  Candy.  Other sweets.  Sodas, specialty coffee drinks, alcohol, and juice. The items listed above may not be a complete list of foods and beverages you should avoid. Contact a dietitian for more information. How do I count calories when eating out?  Pay attention to portions. Often, portions are much larger when eating out. Try these tips to keep portions smaller: ? Consider sharing a meal instead of getting your own. ? If you get your own meal, eat only half of it. Before you start eating, ask for a container and put half of your meal into it. ? When available, consider ordering smaller portions from the menu instead of full portions.  Pay attention to your food and drink choices. Knowing the way food is cooked and what is included with the meal can help you eat fewer calories. ? If calories are listed on the menu, choose the lower-calorie options. ? Choose dishes that include vegetables, fruits, whole grains, low-fat dairy products, and lean proteins. ? Choose items that are boiled, broiled, grilled, or steamed. Avoid items that are buttered, battered, fried, or served with cream sauce. Items labeled as  crispy are usually fried, unless stated otherwise. ? Choose water, low-fat milk, unsweetened iced tea, or other drinks without added sugar. If you want an alcoholic beverage, choose a lower-calorie option, such as a glass of wine or light beer. ? Ask for dressings, sauces, and syrups on the side. These are usually high in calories, so you should limit the amount you eat. ? If you want a salad, choose a garden salad and ask for grilled meats. Avoid extra toppings such as bacon, cheese, or fried items. Ask for the dressing on the side, or ask for olive oil and vinegar or lemon to use as dressing.  Estimate how many servings of a food you are given. Knowing serving sizes will help you be aware of how much food you are eating at restaurants. Where to find more information  Centers for Disease Control and Prevention: www.cdc.gov  [place] Department of Agriculture: myplate.gov Summary  Calorie counting means keeping track of how many calories you eat and drink each day. If you eat fewer calories than your body needs, you should lose weight.  A healthy amount of weight to lose per week is usually 1-2 lb (0.5-0.9 kg). This usually means reducing your daily calorie intake by 500-750 calories.  The number of calories in a food can be found on a Nutrition Facts label. If a food does not have a Nutrition Facts label, try to look up the calories online or ask your dietitian for help.  Use smaller plates, glasses, and bowls for smaller portions and to prevent overeating.  Use your calories on foods and drinks that will fill you up and not leave you hungry shortly after a meal. This information is not intended to replace advice given to you by your health care provider. Make sure you discuss any questions you have with your health care provider. Document Revised: 12/12/2019 Document Reviewed: 12/12/2019 Elsevier Patient Education  2021 [employer] Inc.  

## 2021-03-23 LAB — BMP8+EGFR
BUN/Creatinine Ratio: 24 — ABNORMAL HIGH (ref 9–20)
BUN: 20 mg/dL (ref 6–24)
CO2: 21 mmol/L (ref 20–29)
Calcium: 9.7 mg/dL (ref 8.7–10.2)
Chloride: 103 mmol/L (ref 96–106)
Creatinine, Ser: 0.85 mg/dL (ref 0.76–1.27)
Glucose: 99 mg/dL (ref 65–99)
Potassium: 4.3 mmol/L (ref 3.5–5.2)
Sodium: 140 mmol/L (ref 134–144)
eGFR: 103 mL/min/{1.73_m2} (ref >59)

## 2021-03-23 LAB — TSH: TSH: 2.2 u[IU]/mL (ref 0.450–4.500)

## 2021-04-12 ENCOUNTER — Other Ambulatory Visit: Payer: Self-pay | Admitting: Family

## 2021-04-12 DIAGNOSIS — I10 Essential (primary) hypertension: Secondary | ICD-10-CM

## 2021-05-09 ENCOUNTER — Other Ambulatory Visit: Payer: Self-pay | Admitting: Family

## 2021-05-09 ENCOUNTER — Other Ambulatory Visit: Payer: Self-pay | Admitting: Family Medicine

## 2021-05-09 DIAGNOSIS — M1A09X0 Idiopathic chronic gout, multiple sites, without tophus (tophi): Secondary | ICD-10-CM

## 2021-05-09 DIAGNOSIS — I10 Essential (primary) hypertension: Secondary | ICD-10-CM

## 2021-05-09 DIAGNOSIS — B372 Candidiasis of skin and nail: Secondary | ICD-10-CM

## 2021-07-11 ENCOUNTER — Other Ambulatory Visit: Payer: Self-pay | Admitting: Family

## 2021-07-11 DIAGNOSIS — B372 Candidiasis of skin and nail: Secondary | ICD-10-CM

## 2021-08-09 ENCOUNTER — Other Ambulatory Visit: Payer: Self-pay | Admitting: Family

## 2021-08-12 ENCOUNTER — Telehealth: Payer: Self-pay | Admitting: *Deleted

## 2021-08-12 NOTE — Telephone Encounter (Signed)
Quetiapine 25 mg tab [place] [facility]  - [place] started  Confirmation #:[id number] [place] #:[id number] ZHQUIQ:[id number]

## 2021-09-24 ENCOUNTER — Ambulatory Visit: Payer: Medicaid Other | Admitting: Family

## 2021-10-04 ENCOUNTER — Other Ambulatory Visit: Payer: Self-pay

## 2021-10-04 ENCOUNTER — Ambulatory Visit: Payer: Medicaid Other | Admitting: Family

## 2021-10-04 ENCOUNTER — Encounter: Payer: Self-pay | Admitting: Family

## 2021-10-04 VITALS — BP 138/77 | HR 81 | Temp 97.9°F | Ht 61.0 in | Wt 341.0 lb

## 2021-10-04 DIAGNOSIS — Z23 Encounter for immunization: Secondary | ICD-10-CM

## 2021-10-04 DIAGNOSIS — G4733 Obstructive sleep apnea (adult) (pediatric): Secondary | ICD-10-CM

## 2021-10-04 DIAGNOSIS — E039 Hypothyroidism, unspecified: Secondary | ICD-10-CM

## 2021-10-04 DIAGNOSIS — E785 Hyperlipidemia, unspecified: Secondary | ICD-10-CM

## 2021-10-04 DIAGNOSIS — I10 Essential (primary) hypertension: Secondary | ICD-10-CM

## 2021-10-04 DIAGNOSIS — F321 Major depressive disorder, single episode, moderate: Secondary | ICD-10-CM

## 2021-10-04 DIAGNOSIS — F411 Generalized anxiety disorder: Secondary | ICD-10-CM

## 2021-10-04 DIAGNOSIS — Z9989 Dependence on other enabling machines and devices: Secondary | ICD-10-CM

## 2021-10-04 NOTE — Progress Notes (Signed)
Subjective:    Patient ID: Hayden Hayes, male    DOB: 1966-05-28, 55 y.o.   MRN: [id number]  Chief Complaint  Patient presents with   Medical Management of Chronic Issues   PT presents to the office today for chronic follow up. Pt has seen General Surgery of large panniculus, but was told he was not a candidate for surgery.   He reports he is having some mild tenderness in the area.  Hypertension This is a chronic problem. The current episode started more than 1 year ago. The problem has been resolved since onset. The problem is controlled. Associated symptoms include anxiety and malaise/fatigue. Pertinent negatives include no peripheral edema or shortness of breath. Risk factors for coronary artery disease include dyslipidemia, obesity and male gender. The current treatment provides moderate improvement. Identifiable causes of hypertension include a thyroid problem.  Thyroid Problem Presents for follow-up visit. Symptoms include depressed mood and fatigue. Patient reports no anxiety, constipation or diarrhea. The symptoms have been stable. His past medical history is significant for hyperlipidemia.  Hyperlipidemia This is a chronic problem. The current episode started more than 1 year ago. Exacerbating diseases include obesity. Pertinent negatives include no shortness of breath. Current antihyperlipidemic treatment includes statins. The current treatment provides moderate improvement of lipids. Risk factors for coronary artery disease include dyslipidemia, male sex, hypertension and a sedentary lifestyle.  Anxiety Presents for follow-up visit. Symptoms include depressed mood, excessive worry, irritability and restlessness. Patient reports no nervous/anxious behavior or shortness of breath.    Depression        This is a chronic problem.  The current episode started more than 1 year ago.   The problem occurs intermittently.  Associated symptoms include fatigue, helplessness, hopelessness,  irritable, restlessness and sad.  Past medical history includes thyroid problem and anxiety.   OSA Uses CPAP nightly. Stable.    Review of Systems  Constitutional:  Positive for fatigue, irritability and malaise/fatigue.  Respiratory:  Negative for shortness of breath.   Gastrointestinal:  Negative for constipation and diarrhea.  Psychiatric/Behavioral:  Positive for depression. The patient is not nervous/anxious.   All other systems reviewed and are negative.     Objective:   Physical Exam Vitals reviewed.  Constitutional:      General: He is irritable. He is not in acute distress.    Appearance: He is well-developed. He is obese.  HENT:     Head: Normocephalic.     Right Ear: Tympanic membrane normal.     Left Ear: Tympanic membrane normal.  Eyes:     General:        Right eye: No discharge.        Left eye: No discharge.     Pupils: Pupils are equal, round, and reactive to light.  Neck:     Thyroid: No thyromegaly.  Cardiovascular:     Rate and Rhythm: Normal rate and regular rhythm.     Heart sounds: Normal heart sounds. No murmur heard. Pulmonary:     Effort: Pulmonary effort is normal. No respiratory distress.     Breath sounds: Normal breath sounds. No wheezing.  Abdominal:     General: Bowel sounds are normal. There is no distension.     Palpations: Abdomen is soft.     Tenderness: There is no abdominal tenderness.  Musculoskeletal:        General: No tenderness. Normal range of motion.     Cervical back: Normal range of motion and neck supple.  Skin:  General: Skin is warm and dry.     Findings: No erythema or rash.     Comments: Extremely large panniculus, no redness noted   Neurological:     Mental Status: He is alert and oriented to person, place, and time.     Cranial Nerves: No cranial nerve deficit.     Deep Tendon Reflexes: Reflexes are normal and symmetric.  Psychiatric:        Behavior: Behavior normal.        Thought Content: Thought content  normal.        Judgment: Judgment normal.     BP 138/77   Pulse 81   Temp 97.9 F (36.6 C) (Temporal)   Ht _0  (1.549 m)   Wt (!) 341 lb (154.7 kg)   BMI 64.43 kg/m      Assessment & Plan:  Hayden Hayes comes in today with chief complaint of Medical Management of Chronic Issues   Diagnosis and orders addressed:  1. Need for immunization against influenza - Flu Vaccine QUAD 66moIM (Fluarix, Fluzone & Alfiuria Quad PF) - CMP14+EGFR - CBC with Differential/Platelet  2. Essential hypertension with goal blood pressure less than 130/80  - CMP14+EGFR - CBC with Differential/Platelet  3. OSA on CPAP - CMP14+EGFR - CBC with Differential/Platelet  4. Hypothyroidism, unspecified type - CMP14+EGFR - TSH - CBC with Differential/Platelet  5. Super-super obese with giant panniculus - CMP14+EGFR - CBC with Differential/Platelet  6. Hyperlipidemia, unspecified hyperlipidemia type - CMP14+EGFR - CBC with Differential/Platelet  7. Depression, major, single episode, moderate (HCC) - CMP14+EGFR - CBC with Differential/Platelet  8. GAD (generalized anxiety disorder) - CMP14+EGFR - CBC with Differential/Platelet   Labs pending Health Maintenance reviewed Diet and exercise encouraged  Follow up plan: 6 months    CEvelina Dun FNP

## 2021-10-04 NOTE — Patient Instructions (Signed)

## 2021-10-05 LAB — CBC WITH DIFFERENTIAL/PLATELET
Basophils Absolute: 0 10*3/uL (ref 0.0–0.2)
Basos: 1 %
EOS (ABSOLUTE): 0.2 10*3/uL (ref 0.0–0.4)
Eos: 3 %
Hematocrit: 41.8 % (ref 37.5–51.0)
Hemoglobin: 14.1 g/dL (ref 13.0–17.7)
Immature Grans (Abs): 0 10*3/uL (ref 0.0–0.1)
Immature Granulocytes: 0 %
Lymphocytes Absolute: 1.1 10*3/uL (ref 0.7–3.1)
Lymphs: 20 %
MCH: 31.8 pg (ref 26.6–33.0)
MCHC: 33.7 g/dL (ref 31.5–35.7)
MCV: 94 fL (ref 79–97)
Monocytes Absolute: 0.5 10*3/uL (ref 0.1–0.9)
Monocytes: 8 %
Neutrophils Absolute: 3.8 10*3/uL (ref 1.4–7.0)
Neutrophils: 68 %
Platelets: 224 10*3/uL (ref 150–450)
RBC: 4.44 x10E6/uL (ref 4.14–5.80)
RDW: 13.8 % (ref 11.6–15.4)
WBC: 5.6 10*3/uL (ref 3.4–10.8)

## 2021-10-05 LAB — CMP14+EGFR
ALT: 16 IU/L (ref 0–44)
AST: 19 IU/L (ref 0–40)
Albumin/Globulin Ratio: 1.4 (ref 1.2–2.2)
Albumin: 4.7 g/dL (ref 3.8–4.9)
Alkaline Phosphatase: 77 IU/L (ref 44–121)
BUN/Creatinine Ratio: 20 (ref 9–20)
BUN: 15 mg/dL (ref 6–24)
Bilirubin Total: 0.7 mg/dL (ref 0.0–1.2)
CO2: 23 mmol/L (ref 20–29)
Calcium: 9.4 mg/dL (ref 8.7–10.2)
Chloride: 104 mmol/L (ref 96–106)
Creatinine, Ser: 0.76 mg/dL (ref 0.76–1.27)
Globulin, Total: 3.4 g/dL (ref 1.5–4.5)
Glucose: 102 mg/dL — ABNORMAL HIGH (ref 70–99)
Potassium: 4 mmol/L (ref 3.5–5.2)
Sodium: 140 mmol/L (ref 134–144)
Total Protein: 8.1 g/dL (ref 6.0–8.5)
eGFR: 105 mL/min/{1.73_m2} (ref >59)

## 2021-10-05 LAB — TSH: TSH: 2.14 u[IU]/mL (ref 0.450–4.500)

## 2021-10-12 ENCOUNTER — Other Ambulatory Visit: Payer: Self-pay | Admitting: Family

## 2021-10-12 DIAGNOSIS — I10 Essential (primary) hypertension: Secondary | ICD-10-CM

## 2021-12-13 ENCOUNTER — Other Ambulatory Visit: Payer: Self-pay | Admitting: Family

## 2021-12-13 DIAGNOSIS — M1A09X0 Idiopathic chronic gout, multiple sites, without tophus (tophi): Secondary | ICD-10-CM

## 2022-01-12 ENCOUNTER — Other Ambulatory Visit: Payer: Self-pay | Admitting: Family

## 2022-01-12 DIAGNOSIS — E785 Hyperlipidemia, unspecified: Secondary | ICD-10-CM

## 2022-03-14 ENCOUNTER — Other Ambulatory Visit: Payer: Self-pay | Admitting: Family

## 2022-04-01 ENCOUNTER — Other Ambulatory Visit: Payer: Self-pay | Admitting: Family

## 2022-04-04 ENCOUNTER — Ambulatory Visit: Payer: Medicaid Other | Admitting: Family

## 2022-04-14 DEATH — deceased

## 2022-07-12 IMAGING — DX DG FOOT COMPLETE 3+V*R*
3 series · 3 of 3 positions shown · non-contrast
Comparison: None.

CLINICAL DATA: Right heel pain.  History of gout.

EXAM:
RIGHT FOOT COMPLETE - 3+ VIEW

[foot ap]
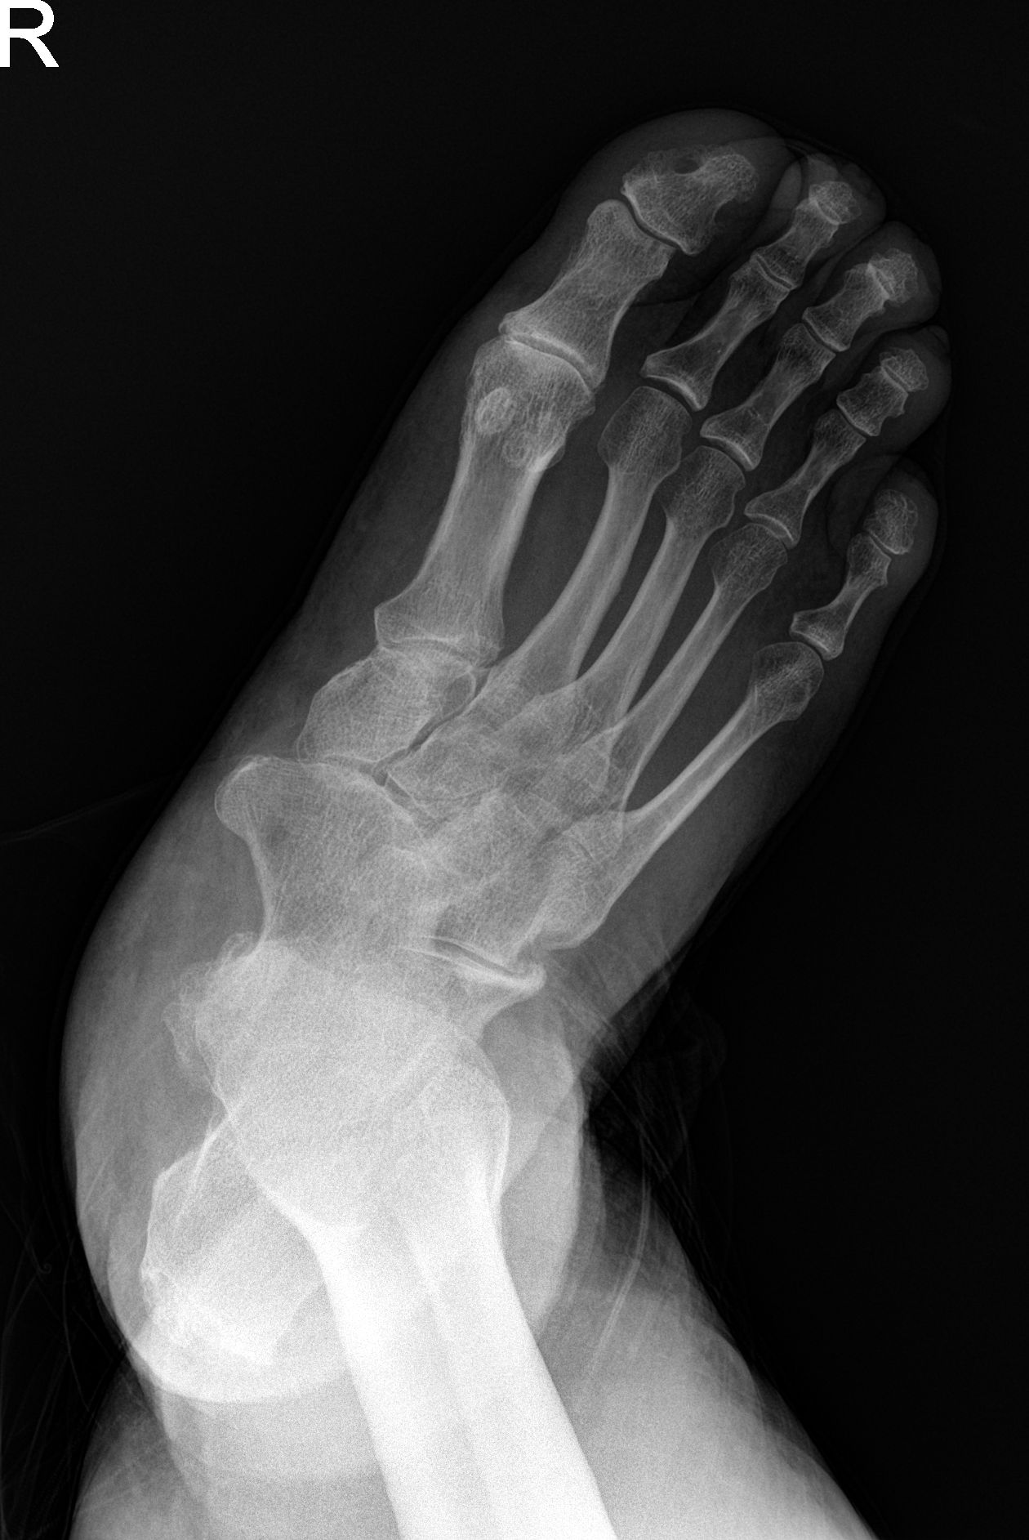

[foot obl]
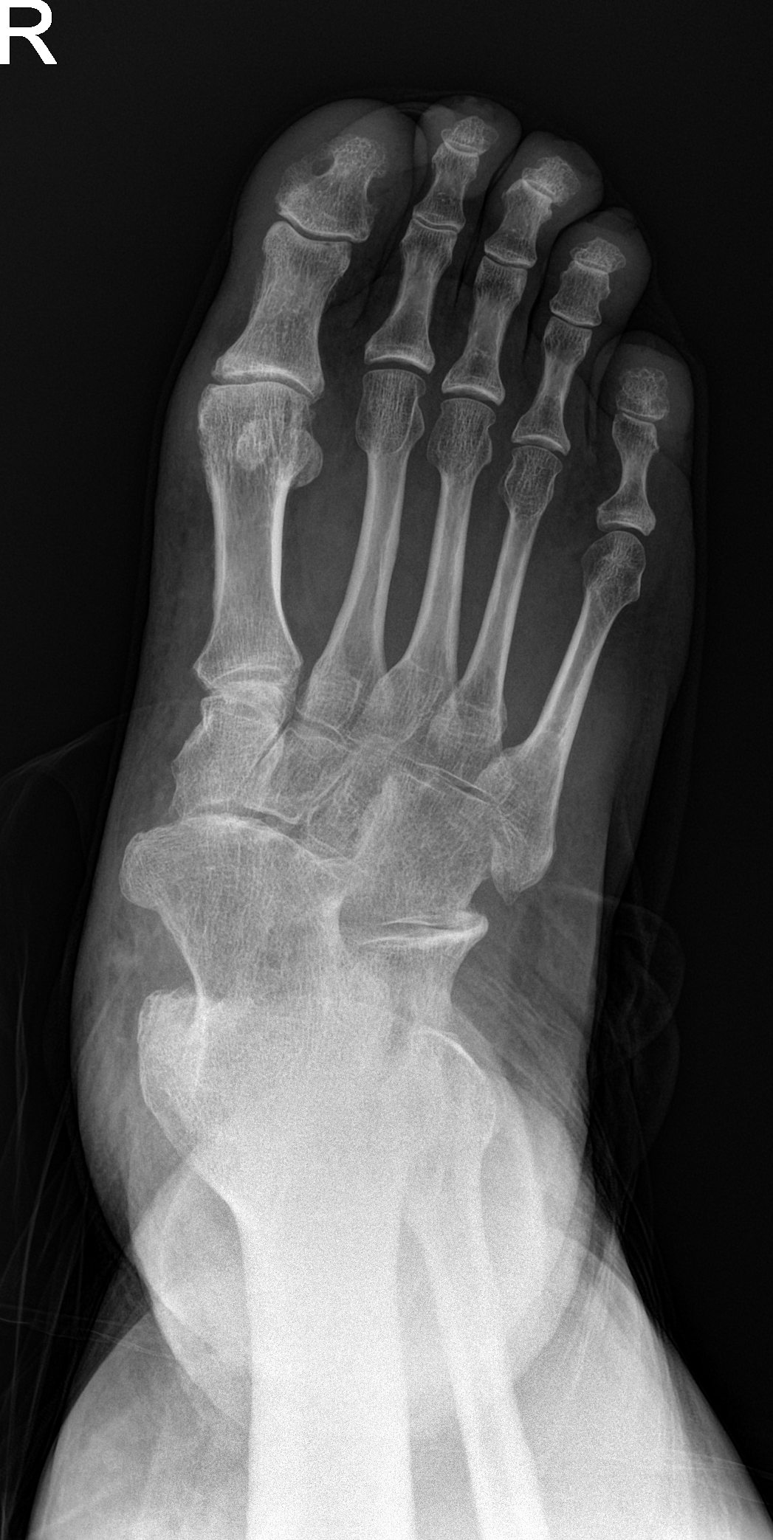

[foot lat]
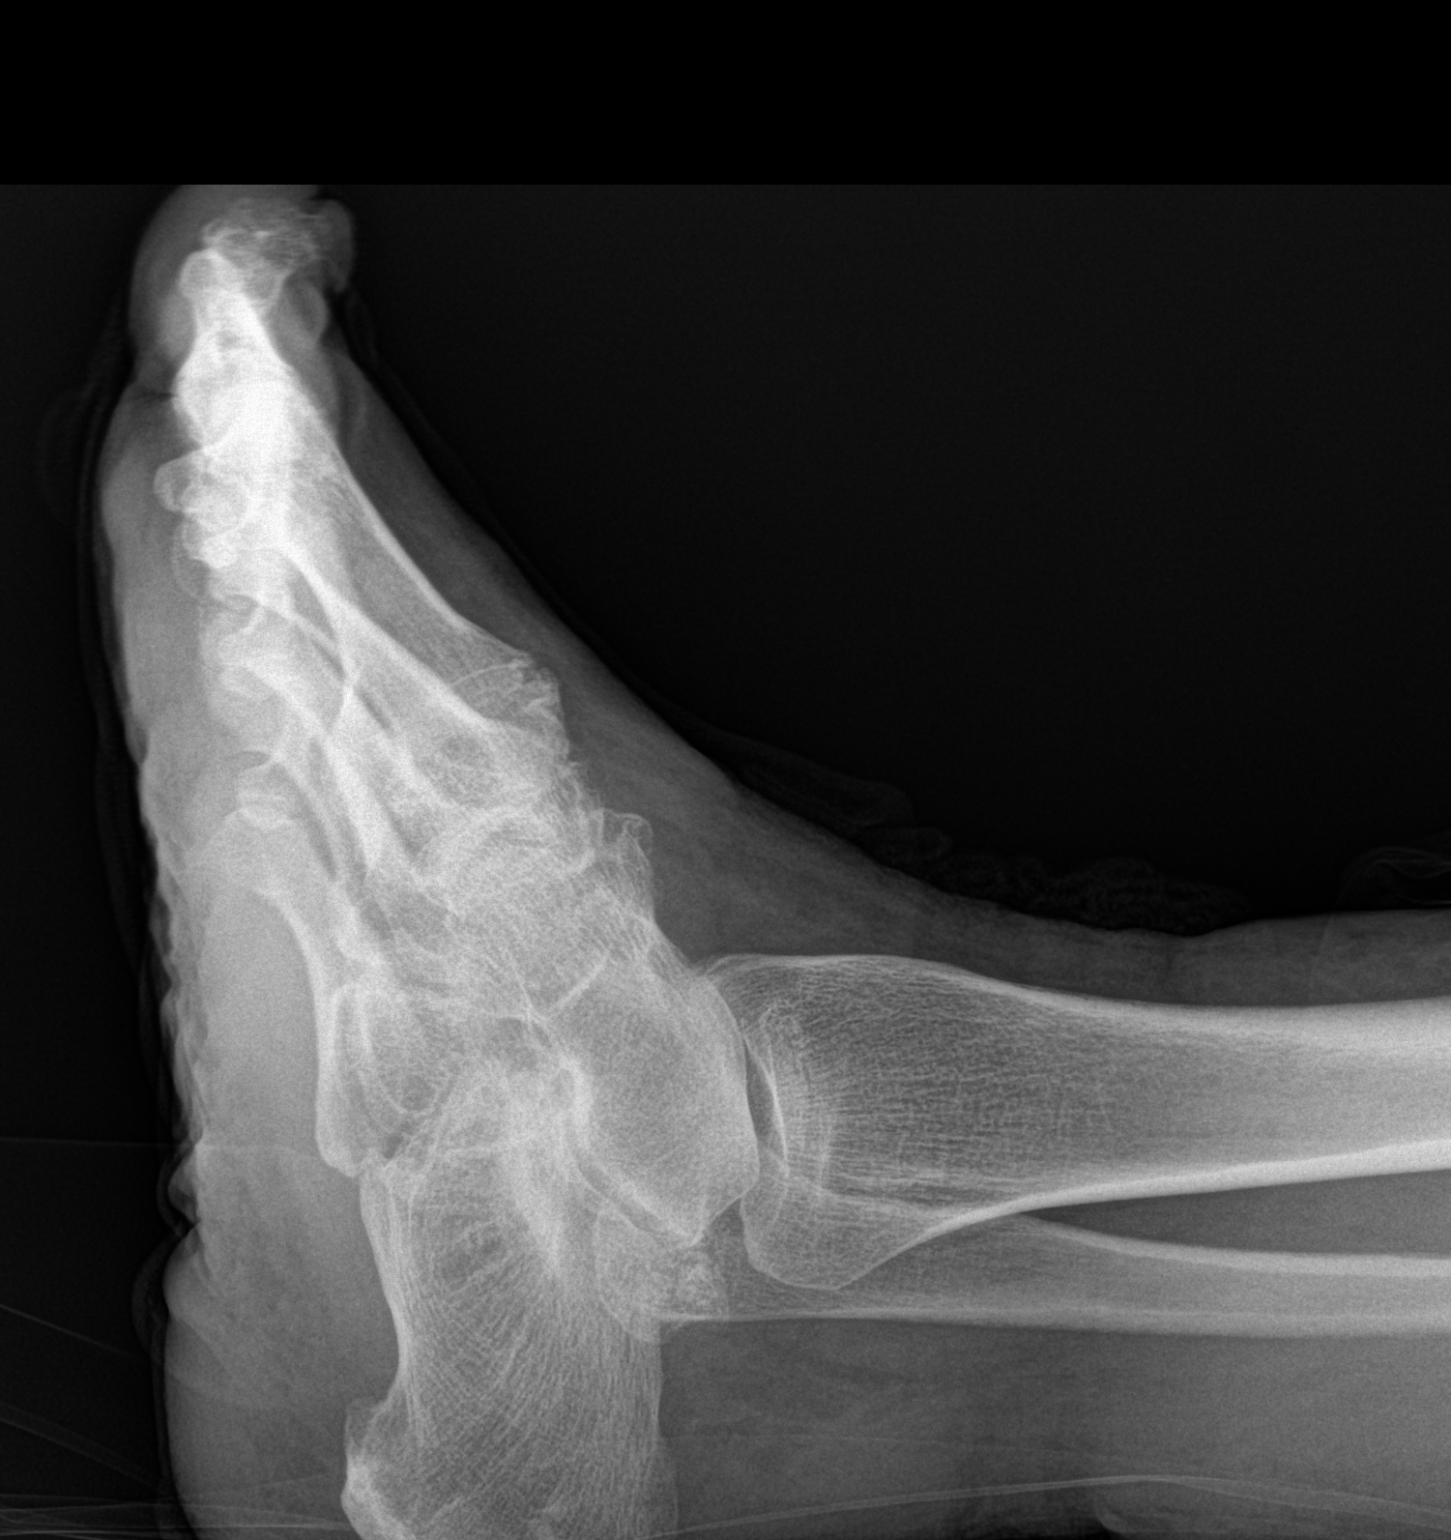

[3 of 3 positions shown; findings below may reference images not displayed]

FINDINGS: The bones appear adequately mineralized. There is no evidence of
acute fracture or dislocation. Moderate degenerative changes are
present at the 1st metatarsophalangeal joint. Mild to moderate
midfoot degenerative changes are also present, with involvement of
the calcaneocuboid articulation. The cross-table lateral view is
limited by motion and excludes the posterior aspect of the calcaneal
tuberosity. No erosive changes, bone destruction or soft tissue
calcifications are identified.
IMPRESSION: 1. Limited study excluding the posterior aspect of the calcaneal
tuberosity in this patient with heel pain. Consider short-term
radiographic follow-up.
2. Midfoot and 1st MTP degenerative changes.
